# Patient Record
Sex: Female | Born: 1955
Health system: Southern US, Community
[De-identification: ages and names within clinical notes are randomized; demographics above are authoritative.]

## PROBLEM LIST (undated history)

## (undated) DIAGNOSIS — K5792 Diverticulitis of intestine, part unspecified, without perforation or abscess without bleeding: Secondary | ICD-10-CM

## (undated) DIAGNOSIS — Z8619 Personal history of other infectious and parasitic diseases: Secondary | ICD-10-CM

## (undated) DIAGNOSIS — E039 Hypothyroidism, unspecified: Secondary | ICD-10-CM

## (undated) DIAGNOSIS — J45909 Unspecified asthma, uncomplicated: Secondary | ICD-10-CM

## (undated) HISTORY — DX: Personal history of other infectious and parasitic diseases: Z86.19

## (undated) HISTORY — DX: Diverticulitis of intestine, part unspecified, without perforation or abscess without bleeding: K57.92

## (undated) HISTORY — PX: DERMOID CYST  EXCISION: SHX1452

## (undated) HISTORY — DX: Unspecified asthma, uncomplicated: J45.909

## (undated) HISTORY — DX: Hypothyroidism, unspecified: E03.9

---

## 1998-04-24 ENCOUNTER — Other Ambulatory Visit: Admission: RE | Admit: 1998-04-24 | Discharge: 1998-04-24 | Payer: Self-pay | Admitting: Obstetrics and Gynecology

## 1999-10-20 ENCOUNTER — Other Ambulatory Visit: Admission: RE | Admit: 1999-10-20 | Discharge: 1999-10-20 | Payer: Self-pay | Admitting: Obstetrics and Gynecology

## 2001-05-03 ENCOUNTER — Other Ambulatory Visit: Admission: RE | Admit: 2001-05-03 | Discharge: 2001-05-03 | Payer: Self-pay | Admitting: Obstetrics and Gynecology

## 2002-10-20 ENCOUNTER — Other Ambulatory Visit: Admission: RE | Admit: 2002-10-20 | Discharge: 2002-10-20 | Payer: Self-pay | Admitting: Obstetrics and Gynecology

## 2003-04-02 ENCOUNTER — Encounter: Payer: Self-pay | Admitting: Internal Medicine

## 2003-04-02 ENCOUNTER — Ambulatory Visit (HOSPITAL_COMMUNITY): Admission: RE | Admit: 2003-04-02 | Discharge: 2003-04-02 | Payer: Self-pay | Admitting: Internal Medicine

## 2003-11-09 ENCOUNTER — Other Ambulatory Visit: Admission: RE | Admit: 2003-11-09 | Discharge: 2003-11-09 | Payer: Self-pay | Admitting: Obstetrics and Gynecology

## 2005-01-16 ENCOUNTER — Ambulatory Visit: Payer: Self-pay | Admitting: Internal Medicine

## 2005-04-06 ENCOUNTER — Ambulatory Visit: Payer: Self-pay | Admitting: Internal Medicine

## 2005-05-04 ENCOUNTER — Other Ambulatory Visit: Admission: RE | Admit: 2005-05-04 | Discharge: 2005-05-04 | Payer: Self-pay | Admitting: Obstetrics and Gynecology

## 2005-05-29 ENCOUNTER — Encounter: Admission: RE | Admit: 2005-05-29 | Discharge: 2005-05-29 | Payer: Self-pay | Admitting: General Surgery

## 2005-06-15 ENCOUNTER — Encounter (INDEPENDENT_AMBULATORY_CARE_PROVIDER_SITE_OTHER): Payer: Self-pay | Admitting: *Deleted

## 2005-06-15 ENCOUNTER — Inpatient Hospital Stay (HOSPITAL_COMMUNITY): Admission: RE | Admit: 2005-06-15 | Discharge: 2005-06-18 | Payer: Self-pay | Admitting: General Surgery

## 2005-06-15 HISTORY — PX: COLON SURGERY: SHX602

## 2010-10-15 ENCOUNTER — Encounter (INDEPENDENT_AMBULATORY_CARE_PROVIDER_SITE_OTHER): Payer: Self-pay | Admitting: *Deleted

## 2010-10-31 ENCOUNTER — Encounter: Admission: RE | Admit: 2010-10-31 | Discharge: 2010-10-31 | Payer: Self-pay | Admitting: Obstetrics and Gynecology

## 2010-12-03 ENCOUNTER — Encounter (INDEPENDENT_AMBULATORY_CARE_PROVIDER_SITE_OTHER): Payer: Self-pay | Admitting: *Deleted

## 2010-12-05 ENCOUNTER — Ambulatory Visit
Admission: RE | Admit: 2010-12-05 | Discharge: 2010-12-05 | Payer: Self-pay | Source: Home / Self Care | Attending: Internal Medicine | Admitting: Internal Medicine

## 2010-12-05 ENCOUNTER — Telehealth (INDEPENDENT_AMBULATORY_CARE_PROVIDER_SITE_OTHER): Payer: Self-pay | Admitting: *Deleted

## 2010-12-19 ENCOUNTER — Other Ambulatory Visit: Payer: Self-pay | Admitting: Internal Medicine

## 2010-12-19 ENCOUNTER — Ambulatory Visit
Admission: RE | Admit: 2010-12-19 | Discharge: 2010-12-19 | Payer: Self-pay | Source: Home / Self Care | Attending: Internal Medicine | Admitting: Internal Medicine

## 2010-12-24 IMAGING — US US OUTSIDE FILMS BREAST
1 series · 5 of 5 positions shown · non-contrast
Comparison: none

[Series 1: us outside films breast · 5 of 5 slices shown]
[im 1/5]
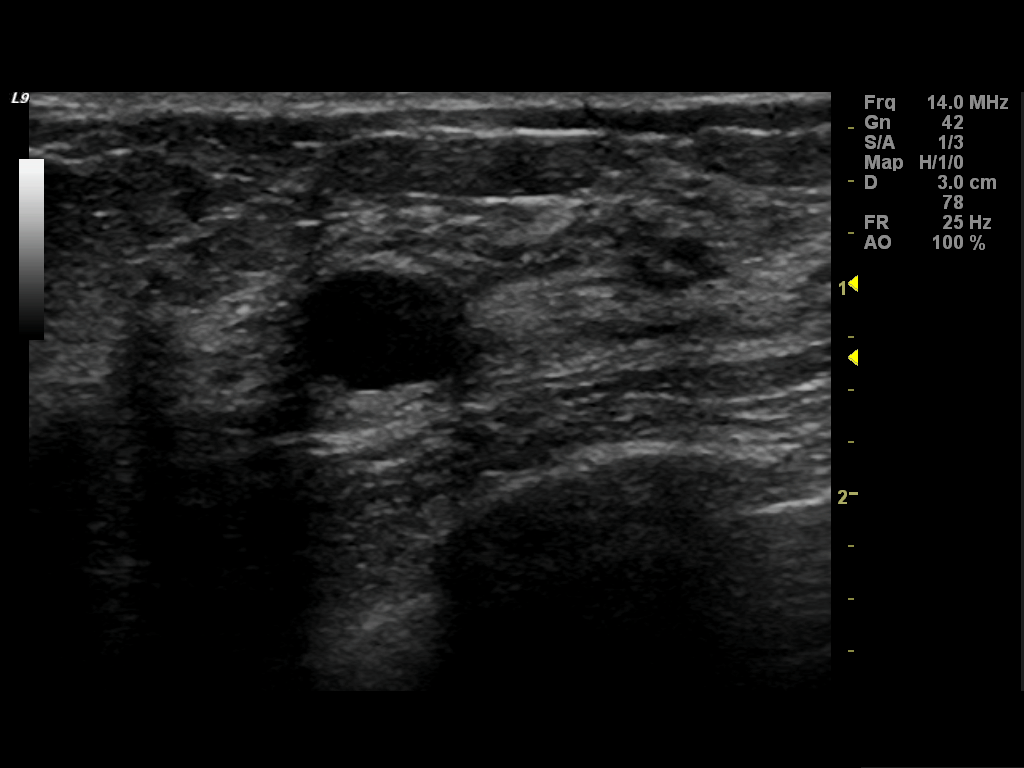
[im 2/5]
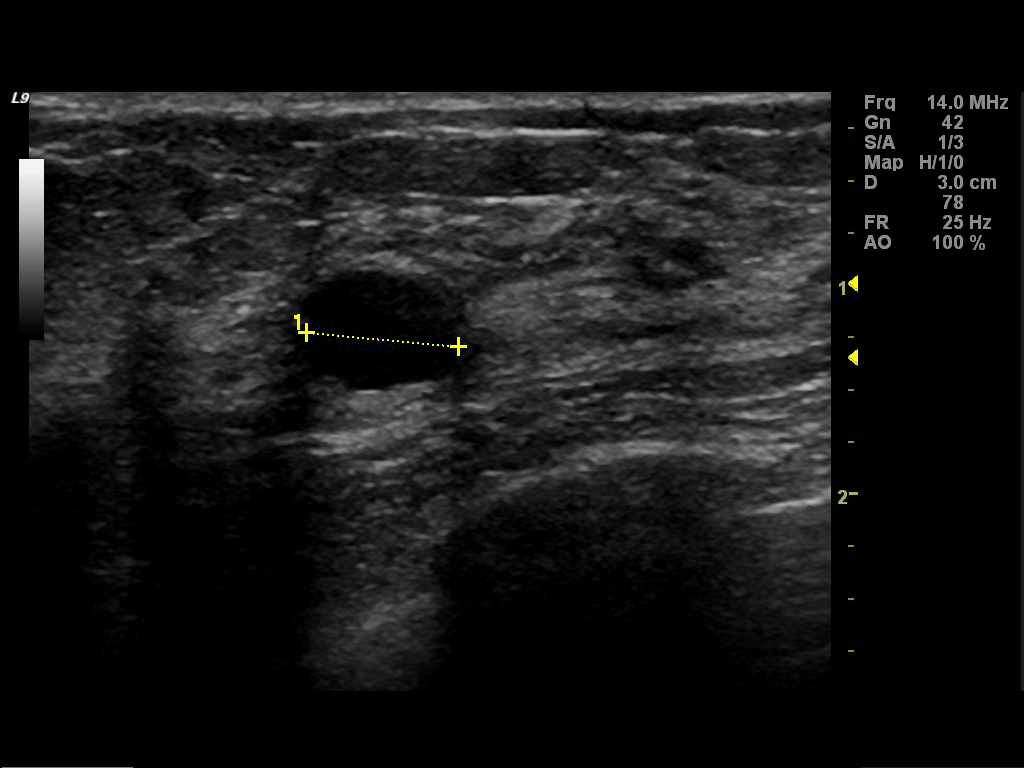
[im 3/5]
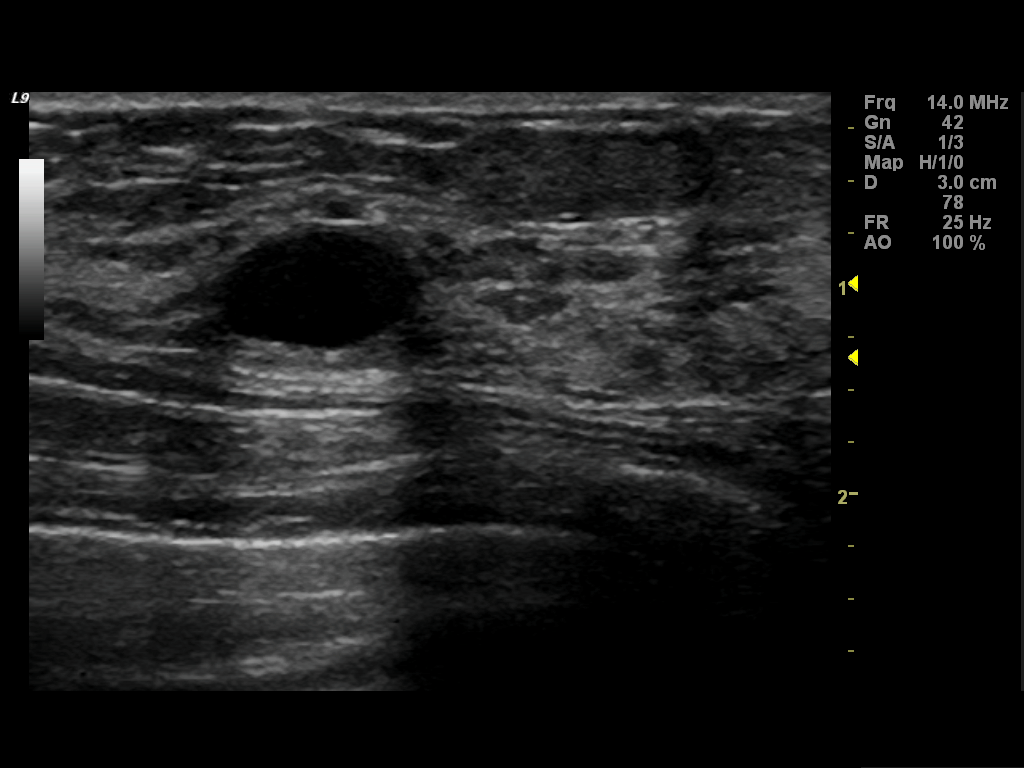
[im 4/5]
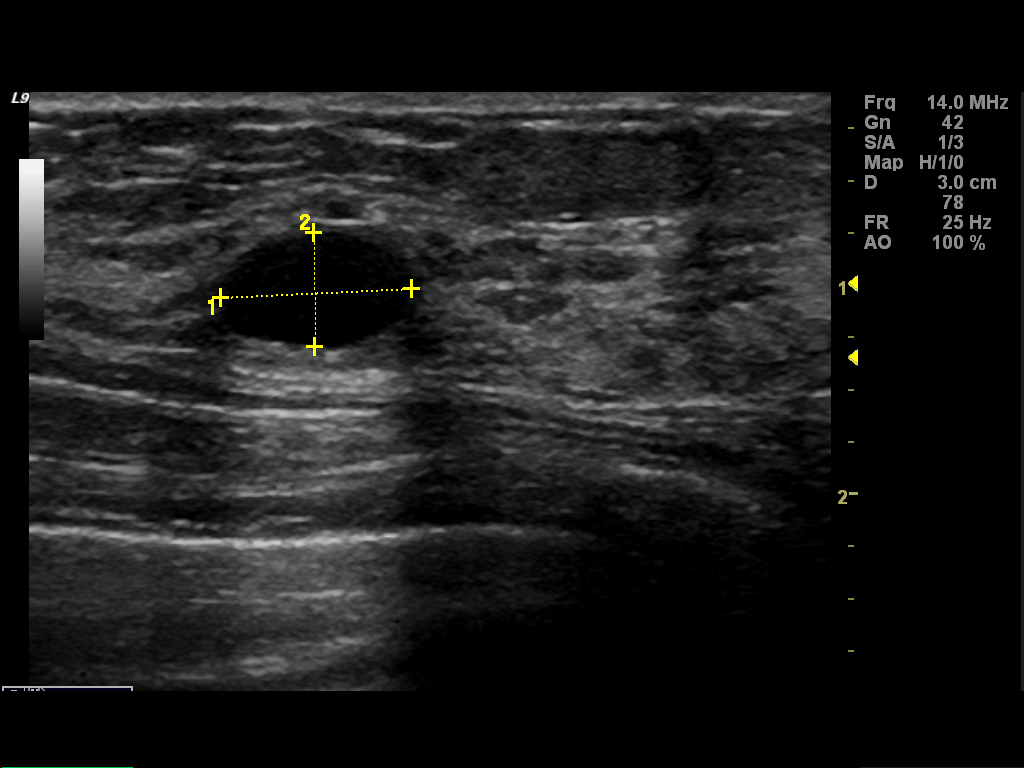
[im 5/5]
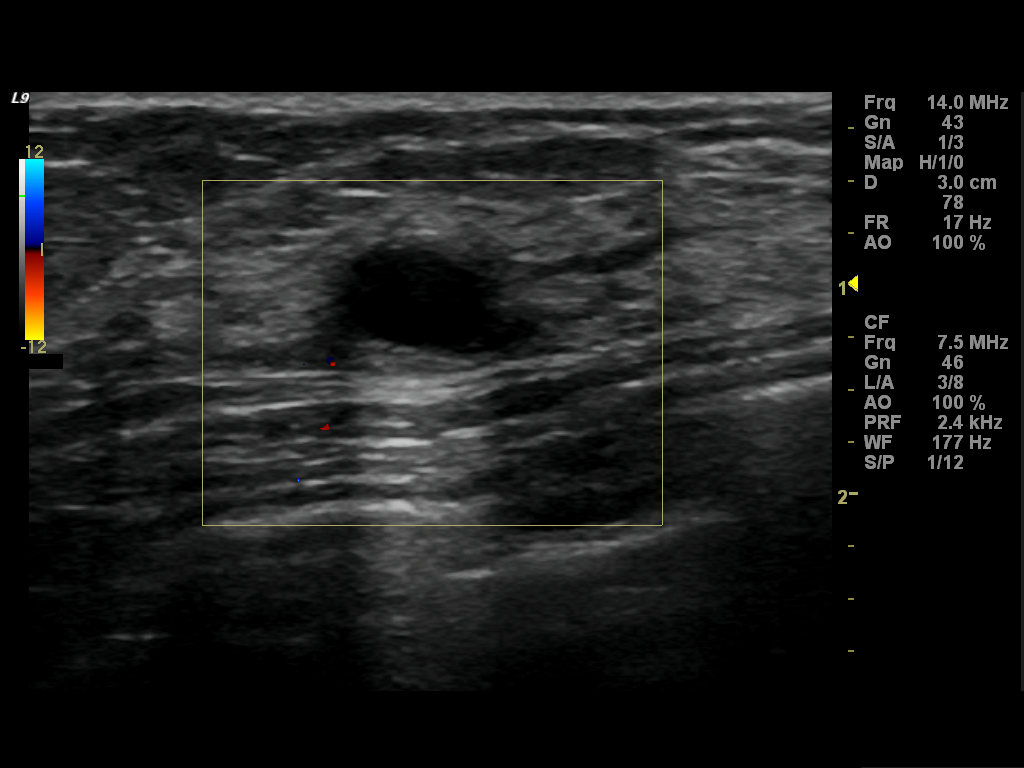

[5 of 5 positions shown; findings below may reference images not displayed]

IMAGES IMPORTED FROM THE SYNGO WORKFLOW SYSTEM
NO DICTATION FOR STUDY

## 2010-12-26 ENCOUNTER — Encounter: Payer: Self-pay | Admitting: Internal Medicine

## 2010-12-30 NOTE — Letter (Signed)
Summary: Pre Visit Letter Revised  [facility]  [address] [place], [place] [address]   Phone: [phone]  Fax: [phone]        10/15/2010 MRN: [id number]   [facility] [address] [place], [place]  [address]             Procedure Date:  11/13/10   Welcome to the Gastroenterology Division at [facility].    You are scheduled to see a nurse for your pre-procedure visit on 10/30/10 at 11:00 a.m. on the 3rd floor at [facility], [address]  We ask that you try to arrive at our office 15 minutes prior to your appointment time to allow for check-in.  Please take a minute to review the attached form.  If you answer "Yes" to one or more of the questions on the first page, we ask that you call the person listed at your earliest opportunity.  If you answer "No" to all of the questions, please complete the rest of the form and bring it to your appointment.    Your nurse visit will consist of discussing your medical and surgical history, your immediate family medical history, and your medications.   If you are unable to list all of your medications on the form, please bring the medication bottles to your appointment and we will list them.  We will need to be aware of both prescribed and over the counter drugs.  We will need to know exact dosage information as well.    Please be prepared to read and sign documents such as consent forms, a financial agreement, and acknowledgement forms.  If necessary, and with your consent, a friend or relative is welcome to sit-in on the nurse visit with you.  Please bring your insurance card so that we may make a copy of it.  If your insurance requires a referral to see a specialist, please bring your referral form from your primary care physician.  No co-pay is required for this nurse visit.     If you cannot keep your appointment, please call [phone] to cancel or reschedule prior to your appointment date.  This allows  [place] the opportunity to schedule an appointment for another patient in need of care.    Thank you for choosing [facility] for your medical needs.  We appreciate the opportunity to care for you.  Please visit [place] at our website  to learn more about our practice.  Sincerely, The Gastroenterology Division

## 2011-01-01 NOTE — Letter (Signed)
Summary: Moviprep Instructions  [facility]  [address]   [place], [place] [address]   Phone: [phone]  Fax: [phone]       Amanda [name]    01-Jun-1956    MRN: [id number]        Procedure Day /Date: Friday, 12-19-10     Arrival Time: 8:30 a.m.      Procedure Time: 9:30 a.m.     Location of Procedure:                    x   [facility] (4th Floor)                        PREPARATION FOR COLONOSCOPY WITH MOVIPREP   Starting 5 days prior to your procedure 12-14-10 do not eat nuts, seeds, popcorn, corn, beans, peas,  salads, or any raw vegetables.  Do not take any fiber supplements (e.g. Metamucil, Citrucel, and Benefiber).  THE DAY BEFORE YOUR PROCEDURE         DATE: 12-18-10  DAY: Thursday 1.  Drink clear liquids the entire day-NO SOLID FOOD  2.  Do not drink anything colored red or purple.  Avoid juices with pulp.  No orange juice.  3.  Drink at least 64 oz. (8 glasses) of fluid/clear liquids during the day to prevent dehydration and help the prep work efficiently.  CLEAR LIQUIDS INCLUDE: Water Jello Ice Popsicles Tea (sugar ok, no milk/cream) Powdered fruit flavored drinks Coffee (sugar ok, no milk/cream) Gatorade Juice: apple, white grape, white cranberry  Lemonade Clear bullion, consomm, broth Carbonated beverages (any kind) Strained chicken noodle soup Hard Candy                             4.  In the morning, mix first dose of MoviPrep solution:    Empty 1 Pouch A and 1 Pouch B into the disposable container    Add lukewarm drinking water to the top line of the container. Mix to dissolve    Refrigerate (mixed solution should be used within 24 hrs)  5.  Begin drinking the prep at 5:00 p.m. The MoviPrep container is divided by 4 marks.   Every 15 minutes drink the solution down to the next mark (approximately 8 oz) until the full liter is complete.   6.  Follow completed prep with 16 oz of clear liquid of your choice  (Nothing red or purple).  Continue to drink clear liquids until bedtime.  7.  Before going to bed, mix second dose of MoviPrep solution:    Empty 1 Pouch A and 1 Pouch B into the disposable container    Add lukewarm drinking water to the top line of the container. Mix to dissolve    Refrigerate  THE DAY OF YOUR PROCEDURE      DATE: 12-19-10  DAY: Friday  Beginning at 4:30 a.m. (5 hours before procedure):         1. Every 15 minutes, drink the solution down to the next mark (approx 8 oz) until the full liter is complete.  2. Follow completed prep with 16 oz. of clear liquid of your choice.    3. You may drink clear liquids until 7:30 a.m.  (2 HOURS BEFORE PROCEDURE).   MEDICATION INSTRUCTIONS  Unless otherwise instructed, you should take regular prescription medications with a small sip of water   as early as possible the  morning of your procedure.         OTHER INSTRUCTIONS  You will need a responsible adult at least 55 years of age to accompany you and drive you home.   This person must remain in the waiting room during your procedure.  Wear loose fitting clothing that is easily removed.  Leave jewelry and other valuables at home.  However, you may wish to bring a book to read or  an iPod/MP3 player to listen to music as you wait for your procedure to start.  Remove all body piercing jewelry and leave at home.  Total time from sign-in until discharge is approximately 2-3 hours.  You should go home directly after your procedure and rest.  You can resume normal activities the  day after your procedure.  The day of your procedure you should not:   Drive   Make legal decisions   Operate machinery   Drink alcohol   Return to work  You will receive specific instructions about eating, activities and medications before you leave.    The above instructions have been reviewed and explained to me by   Ezra Sites RN  December 05, 2010 1:53 PM     I fully  understand and can verbalize these instructions _____________________________ Date _________

## 2011-01-01 NOTE — Letter (Addendum)
Summary: Patient Notice-Hyperplastic Polyps   Gastroenterology  [address] [place], [place] [address]   Phone: [phone]  Fax: [phone]        December 26, 2010 MRN: [id number]    [facility] [address] [place], [place]  [address]    Dear [name],  I am pleased to inform you that the colon polyp removed during your recent colonoscopy was NOT pre-cancerous.  It is therefore my recommendation that you have a repeat colonoscopy examination in 10 years for routine colorectal cancer screening.  Should you develop new or worsening symptoms of abdominal pain, bowel habit changes or bleeding from the rectum or bowels, please schedule an evaluation with either your primary care physician or with me.  Please call us if you are having persistent problems or have questions about your condition that have not been fully answered at this time.  Sincerely,  Iva Boop MD, [facility] This letter has been electronically signed by your physician.  Appended Document: Patient Notice-Hyperplastic Polyps LETTER MAILED

## 2011-01-01 NOTE — Procedures (Addendum)
Summary: Colonoscopy  Patient: Amanda [name] Note: All result statuses are Final unless otherwise noted.  Tests: (1) Colonoscopy (COL)   COL Colonoscopy           DONE (C)     [facility]     [address]     [place], [place]  [address]           COLONOSCOPY PROCEDURE REPORT           PATIENT:  [name], [name]  MR#:  [id number]     BIRTHDATE:  17-Jun-1956, 54 yrs. old  GENDER:  female     ENDOSCOPIST:  Iva Boop, MD, [facility]           PROCEDURE DATE:  12/19/2010     PROCEDURE:  Colonoscopy with snare polypectomy     ASA CLASS:  Class II     INDICATIONS:  Routine Risk Screening     MEDICATIONS:   Fentanyl 100 mcg IV, Versed 12 mg IV           DESCRIPTION OF PROCEDURE:   After the risks benefits and     alternatives of the procedure were thoroughly explained, informed     consent was obtained.  Digital rectal exam was performed and     revealed no abnormalities.   The LB 180AL [id number] endoscope was     introduced through the anus and advanced to the cecum, which was     identified by both the appendix and ileocecal valve, without     limitations.  The quality of the prep was excellent, using     MoviPrep CORRECTION: MIRALAX.  The instrument was then slowly     withdrawn as the colon was fully examined. Insertion: 1:30 minutes     withdrawal; 11:31 minutes     <<PROCEDUREIMAGES>>           FINDINGS:  A sessile polyp was found in the sigmoid colon. It was     6 mm in size. Polyp was snared without cautery. Retrieval was     successful.  There was a surgical anastomosis in the sigmoid colon     from prior segmental resection.  Mild diverticulosis was found in     the left colon.   This was otherwise a normal examination of the     colon.   Retroflexed views in the rectum revealed no     abnormalities.    The scope was then withdrawn from the patient     and the procedure completed.           COMPLICATIONS:  None     ENDOSCOPIC IMPRESSION:     1) 6 mm sessile polyp  in the sigmoid colon - removed     2) Anastomosis in the sigmoid colon - prior segmental resection     for recurrent diverticulitis     3) Mild diverticulosis in the left colon     4) Otherwise normal examination with excellent prep           REPEAT EXAM:  In for Colonoscopy, pending biopsy results.           Iva Boop, MD, [facility]           CC:  Jerl Mina, MD     The Patient           n.     REVISED:  12/19/2010 11:43 AM     eSIGNED:   Iva Boop at  12/19/2010 11:43 AM           Judeth Porch, [id number]  Note: An exclamation mark (!) indicates a result that was not dispersed into the flowsheet. Document Creation Date: 12/19/2010 11:43 AM _______________________________________________________________________  (1) Order result status: Final Collection or observation date-time: 12/19/2010 10:30 Requested date-time:  Receipt date-time:  Reported date-time:  Referring Physician:   Ordering Physician: Stan Head [id number]) Specimen Source:  Source: Launa Grill Order Number: [id number] Lab site:   Appended Document: Colonoscopy     Procedures Next Due Date:    Colonoscopy: 12/2020

## 2011-01-01 NOTE — Progress Notes (Signed)
Summary: Need for colon now?  Phone Note Call from Patient   Summary of Call: Dr. Leone Payor, I have put Amanda [name] chart on your desk for review.  She had colonoscopy in 2004 for follow up of diverticulitis. You recommended recall in 2009 for screening.  She had laparoscopic sigmoid colectomy in 2006. Do you still want her to have colon now (scheduled for 12/19/10)?  If so, is it screening or follow up from colectomy?  Please advise. Initial call taken by: Amanda Sites RN,  December 05, 2010 3:08 PM  Follow-up for Phone Call        she should have the colonoscopy and the diagnosis is screening Follow-up by: Amanda Boop MD, Amanda [name],  December 05, 2010 4:55 PM

## 2011-01-01 NOTE — Miscellaneous (Signed)
Summary: [facility]  Clinical Lists Changes  Medications: Added new medication of MOVIPREP 100 GM  SOLR (PEG-KCL-NACL-NASULF-NA ASC-C) As per prep instructions. - Signed Rx of MOVIPREP 100 GM  SOLR (PEG-KCL-NACL-NASULF-NA ASC-C) As per prep instructions.;  #1 x 0;  Signed;  Entered by: Ezra Sites RN;  Authorized by: Iva Boop MD, [facility];  Method used: Electronically to [facility] [address] [place], [place], [place]  [address], Ph: [phone], Fax: [phone] Observations: Added new observation of NKA: T (12/05/2010 13:30)    Prescriptions: MOVIPREP 100 GM  SOLR (PEG-KCL-NACL-NASULF-NA ASC-C) As per prep instructions.  #1 x 0   Entered by:   Ezra Sites RN   Authorized by:   Iva Boop MD, [facility]   Signed by:   Ezra Sites RN on 12/05/2010   Method used:   Electronically to        [facility]  [address] (retail)       [address], [address]       [place], [place]  [address]       Ph: [phone]       Fax: [phone]   RxID:   [id number]

## 2011-04-17 NOTE — Discharge Summary (Signed)
[name], [name]               ACCOUNT NO.:  [id number]   MEDICAL RECORD NO.:  [id number]          PATIENT TYPE:  INP   LOCATION:  1506                         FACILITY:  [facility]   PHYSICIAN:  Adolph Pollack, M.D.DATE OF BIRTH:  [birth year]   DATE OF ADMISSION:  06/15/2005  DATE OF DISCHARGE:  06/18/2005                                 DISCHARGE SUMMARY   PRINCIPAL DISCHARGE DIAGNOSIS:  Recurrent sigmoid diverticulitis.   REASON FOR ADMISSION:  This 55 year old female has had recurring bouts of  sigmoid diverticulitis and has been treated multiple times, specifically  this year.  She was admitted for elective laparoscopic sigmoid colectomy.   HOSPITAL COURSE:  She underwent a laparoscopic sigmoid colectomy on June 15, 2005 and tolerated it well.  The first postoperative day she started on a  clear liquid diet and ambulated.  She began having some flatus on the second  postoperative day and was advanced to a solid diet and oral pain medicine.  By the third postoperative day she was having some small liquid bowel  movements and tolerating a diet.  The wounds were clean, dry, and intact.  She was afebrile and ready to be discharged.   DISPOSITION:  Discharged to home on June 20, 2005 in satisfactory condition.   DISCHARGE INSTRUCTIONS:  She has been given specific discharge instructions.   DISCHARGE MEDICATIONS:  Tylox for pain.   FOLLOW UP:  She will return to the office in four days for staple removal.       TJR/MEDQ  D:  06/18/2005  T:  06/18/2005  Job:  [id number]   cc:   Iva Boop, M.D. [facility]  [address] [place], [place] [address]

## 2011-04-17 NOTE — Op Note (Signed)
NAMEKYNEDI, PROFITT               ACCOUNT NO.:  [id number]   MEDICAL RECORD NO.:  [id number]          PATIENT TYPE:  INP   LOCATION:  1506                         FACILITY:  [facility]   PHYSICIAN:  Adolph Pollack, M.D.DATE OF BIRTH:  Dec 09, 1955   DATE OF PROCEDURE:  06/15/2005  DATE OF DISCHARGE:                                 OPERATIVE REPORT   PREOPERATIVE DIAGNOSIS:  Recurrent sigmoid diverticulitis.   POSTOPERATIVE DIAGNOSIS:  Recurrent sigmoid diverticulitis.   PROCEDURE:  Laparoscopic-assisted sigmoid colectomy.   SURGEON:  Adolph Pollack, M.D.   ASSISTANT:  Anselm Pancoast. Zachery Dakins, M.D.   ANESTHESIA:  General.   INDICATIONS:  Ms. Fullington is a 55 year old female who a couple years ago had  a documented bout of sigmoid diverticulitis.  She has had sigmoid  diverticulitis.  She had a couple mild since January of this year. She now  presents for elective laparoscopic assisted sigmoid colectomy.  We discussed  the procedure and the risks.  A barium enema does show a fairly focal  disease in the mid to proximal sigmoid colon.   DESCRIPTION OF PROCEDURE:  She was seen in the holding area and brought to  the operating room, placed supine on the operating table, and a general  anesthetic was administered.  She was placed in the lithotomy position.  A  Foley catheter was inserted.  The abdominal wall and perineal area was  sterilely prepped and draped.  A small supraumbilical incision was made  through the skin and subcutaneous tissue and fascial layers, and the  peritoneal cavity was entered. The pursestring suture of 0 Vicryl placed  around the fascial edges.  A Hassan trocar was introduced into the  peritoneal cavity. Pneumoperitoneum was created by insufflation of CO2 gas.   Following this, a laparoscope was introduced.  A 5 mm trocar was placed in  the lower midline in the suprapubic area.  She was placed in Trendelenburg  position with the right side tilted down.  A 10  mm trocar was placed in the  right lower quadrant region.  I began by noticing the an adherent area and  inflammatory area in the mid to proximal sigmoid colon to the lateral  sidewall.  I divided these adhesions and began mobilizing the segment.  I  then divided the white line of Toldt up to the mid to proximal descending  colon mobilizing the descending colon.  I identified the ureter and traced  it inferiorly.  I subsequently divided the lateral peritoneal attachments  all the way down to the rectosigmoid junction.  This provided plenty of  mobilization both proximal and distal to the diseased segment.   Following this, I removed the 5 mm trocar and made a lower transverse  incision through skin and subcutaneous tissue and mobilized some of the  subcutaneous tissue.  I then made a midline incision in the lower abdominal  wall fascia noted and entered the peritoneal cavity.  I grasped the diseased  segment. Mobilization was adequate.  I then divided the colon at the  descending colon and sigmoid junction and just above the  sigmoid rectal  junction distally.  The mesentery and the vessels were then divided between  clamps and ligated.  The specimen was handed off the field.   I then approximated the proximal and distal ends of the colon, and they were  under no tension.  A single layer hand sewn anastomosis was done in an  inverted fashion using interrupted 3-0 silk. The anastomosis was patent,  viable and under no tension. Tisseel was then placed around the anastomosis.   The abdominal cavity was irrigated and fluid was evacuated.  Hemostasis was  adequate.  I then placed sigmoid colon up back in the pelvic area and  flattened the patient out.  Omentum was placed over the intestines.  Needle,  sponge and instrument counts were reported to be correct.  The fascia was  then closed a single layer with a running #1 PDS suture.  Subcutaneous  tissue was irrigated. The abdomen was  reinsufflated, and the laparoscope was  introduced.  The fascial closure was solid.  No bleeding or significant  irrigation fluid was noted.  Subsequently, all trocars were removed and  pneumoperitoneum was released.  The supraumbilical fascia defect was closed  by tightening up and tying down the pursestring suture.  All incisions were  then closed with staples followed by sterile dressings.   She tolerated the procedure without apparent complications and was taken to  recovery in satisfactory condition.       TJR/MEDQ  D:  06/15/2005  T:  06/15/2005  Job:  [id number]   cc:   Iva Boop, M.D. [facility]  [address] [place], [place] [address]

## 2013-09-04 ENCOUNTER — Ambulatory Visit (INDEPENDENT_AMBULATORY_CARE_PROVIDER_SITE_OTHER)
Admission: RE | Admit: 2013-09-04 | Discharge: 2013-09-04 | Disposition: A | Discharge status: Home / Self Care | Payer: BC Managed Care – PPO | Source: Ambulatory Visit | Attending: Internal Medicine | Admitting: Internal Medicine

## 2013-09-04 ENCOUNTER — Ambulatory Visit (INDEPENDENT_AMBULATORY_CARE_PROVIDER_SITE_OTHER): Payer: BC Managed Care – PPO | Admitting: Internal Medicine

## 2013-09-04 ENCOUNTER — Other Ambulatory Visit: Payer: Self-pay | Admitting: Internal Medicine

## 2013-09-04 ENCOUNTER — Encounter: Payer: Self-pay | Admitting: Internal Medicine

## 2013-09-04 VITALS — BP 110/80 | HR 109 | Temp 98.1°F | Ht 65.0 in | Wt 140.0 lb

## 2013-09-04 DIAGNOSIS — E039 Hypothyroidism, unspecified: Secondary | ICD-10-CM

## 2013-09-04 DIAGNOSIS — Z13220 Encounter for screening for lipoid disorders: Secondary | ICD-10-CM

## 2013-09-04 DIAGNOSIS — R05 Cough: Secondary | ICD-10-CM

## 2013-09-04 DIAGNOSIS — R059 Cough, unspecified: Secondary | ICD-10-CM

## 2013-09-04 DIAGNOSIS — N951 Menopausal and female climacteric states: Secondary | ICD-10-CM

## 2013-09-04 DIAGNOSIS — J45909 Unspecified asthma, uncomplicated: Secondary | ICD-10-CM

## 2013-09-04 DIAGNOSIS — Z780 Asymptomatic menopausal state: Secondary | ICD-10-CM

## 2013-09-04 DIAGNOSIS — R053 Chronic cough: Secondary | ICD-10-CM

## 2013-09-04 DIAGNOSIS — Z23 Encounter for immunization: Secondary | ICD-10-CM

## 2013-09-04 DIAGNOSIS — D72819 Decreased white blood cell count, unspecified: Secondary | ICD-10-CM

## 2013-09-04 LAB — CBC WITH DIFFERENTIAL/PLATELET
Basophils Relative: 0.5 % (ref 0.0–3.0)
Eosinophils Relative: 0.4 % (ref 0.0–5.0)
Hemoglobin: 14.6 g/dL (ref 12.0–15.0)
Lymphocytes Relative: 35.6 % (ref 12.0–46.0)
Monocytes Relative: 7.7 % (ref 3.0–12.0)
Neutro Abs: 2 10*3/uL (ref 1.4–7.7)
RBC: 4.8 Mil/uL (ref 3.87–5.11)

## 2013-09-04 LAB — COMPREHENSIVE METABOLIC PANEL
BUN: 13 mg/dL (ref 6–23)
CO2: 30 mEq/L (ref 19–32)
Calcium: 9.3 mg/dL (ref 8.4–10.5)
Chloride: 103 mEq/L (ref 96–112)
Creatinine, Ser: 0.9 mg/dL (ref 0.4–1.2)
GFR: 70.29 mL/min (ref >60.00)

## 2013-09-04 LAB — LIPID PANEL
Cholesterol: 201 mg/dL — ABNORMAL HIGH (ref 0–200)
Triglycerides: 75 mg/dL (ref 0.0–149.0)

## 2013-09-04 LAB — LDL CHOLESTEROL, DIRECT: Direct LDL: 109.2 mg/dL

## 2013-09-04 NOTE — Progress Notes (Signed)
Order placed for f/u labs.  

## 2013-09-06 ENCOUNTER — Encounter: Payer: Self-pay | Admitting: Internal Medicine

## 2013-09-06 DIAGNOSIS — R05 Cough: Secondary | ICD-10-CM | POA: Insufficient documentation

## 2013-09-06 DIAGNOSIS — J45909 Unspecified asthma, uncomplicated: Secondary | ICD-10-CM | POA: Insufficient documentation

## 2013-09-06 DIAGNOSIS — E039 Hypothyroidism, unspecified: Secondary | ICD-10-CM | POA: Insufficient documentation

## 2013-09-06 DIAGNOSIS — Z780 Asymptomatic menopausal state: Secondary | ICD-10-CM | POA: Insufficient documentation

## 2013-09-06 DIAGNOSIS — R053 Chronic cough: Secondary | ICD-10-CM | POA: Insufficient documentation

## 2013-09-06 NOTE — Assessment & Plan Note (Signed)
On thyroid replacement.  Follow tsh.  

## 2013-09-06 NOTE — Assessment & Plan Note (Signed)
Has had extensive w/up.  Has been treated for allergies, GERD and asthma - with incomplete resolution.  Told was related to asthma.  She overall feels is stable.  Check cxr.

## 2013-09-06 NOTE — Assessment & Plan Note (Signed)
No period for two years.  Doing well.  Follow.    

## 2013-09-06 NOTE — Progress Notes (Signed)
  Subjective:    Patient ID: Amanda [name], female    DOB: [birth year], 57 y.o.   MRN: [id number]  HPI 57 year old female with past history of hypothyroidism and a chronic cough (felt to be related to asthma).  She comes in today to follow up on these issues as well as to establish care.  Has been followed at [facility] in [place].  Has also seen Dr  Burnett Sheng previously.  States she is due a physical.  Has not had a period for two years.  Some occasional hot flashes.  Not severe.  States she had one abnormal pap smear when pregnant.  All subsequent pap smears have been normal.  She states that she does have a chronic cough.  Worse in am.  Some increased drainage and some increased mucus.  No cough at night.  States it has been persistent for 20 years.  Has had extensive w/up.  Was told it was related to asthma.  States previous PFTs revealed "mild asthma".  Overall she feels she is doing well.  Tries to stay active.     Past Medical History  Diagnosis Date  . Asthma   . History of chicken pox   . Diverticulitis     H/O  . Hypothyroidism     Outpatient Encounter Prescriptions as of 09/04/2013  Medication Sig Dispense Refill  . levothyroxine (SYNTHROID, LEVOTHROID) 88 MCG tablet Take 88 mcg by mouth daily before breakfast.       No facility-administered encounter medications on file as of 09/04/2013.    Review of Systems Patient denies any headache, lightheadedness or dizziness.  Some increased mucus.  Question of increased drainage.  No chest pain, tightness or palpitations.  No increased shortness of breath.  Does report the cough as outlined.   No nausea or vomiting.  No acid reflux.  No abdominal pain or cramping.  No bowel change, such as diarrhea, constipation, BRBPR or melana.  No urine change.   Tries to stay active.  Overall she feels she is doing relatively well.      Objective:   Physical Exam Filed Vitals:   09/04/13 1331  BP: 110/80  Pulse: 109  Temp: 98.1 F (36.7 C)   pulse recheck:  69  57 year old female in no acute distress.   HEENT:  Nares- clear.  Oropharynx - without lesions. NECK:  Supple.  Nontender.  No audible bruit.  HEART:  Appears to be regular. LUNGS:  No crackles or wheezing audible.  Respirations even and unlabored.  RADIAL PULSE:  Equal bilaterally.   ABDOMEN:  Soft, nontender.  Bowel sounds present and normal.  No audible abdominal bruit.   EXTREMITIES:  No increased edema present.  DP pulses palpable and equal bilaterally.          Assessment & Plan:  HEALTH MAINTENANCE.  Schedule her for a physical.  Last mammogram 06/09/12 - ok (per her report).  Obtain records.  Last colonoscopy 11/2010.  Recommended f/u 10 years.    I spent 45 minutes with the patient and more than 50% of the time was spent in consultation regarding the above.

## 2013-09-06 NOTE — Assessment & Plan Note (Signed)
No sob.  No symptoms with exercise.  Follow.

## 2013-09-22 ENCOUNTER — Other Ambulatory Visit (INDEPENDENT_AMBULATORY_CARE_PROVIDER_SITE_OTHER): Payer: BC Managed Care – PPO

## 2013-09-22 DIAGNOSIS — D72819 Decreased white blood cell count, unspecified: Secondary | ICD-10-CM

## 2013-09-22 LAB — CBC WITH DIFFERENTIAL/PLATELET
Basophils Absolute: 0 10*3/uL (ref 0.0–0.1)
Basophils Relative: 0.8 % (ref 0.0–3.0)
HCT: 41.4 % (ref 36.0–46.0)
Hemoglobin: 14.1 g/dL (ref 12.0–15.0)
Lymphs Abs: 1.5 10*3/uL (ref 0.7–4.0)
MCHC: 34.1 g/dL (ref 30.0–36.0)
Monocytes Relative: 8.3 % (ref 3.0–12.0)
Neutro Abs: 2.4 10*3/uL (ref 1.4–7.7)
RBC: 4.61 Mil/uL (ref 3.87–5.11)
RDW: 13.2 % (ref 11.5–14.6)

## 2013-09-23 ENCOUNTER — Encounter: Payer: Self-pay | Admitting: Internal Medicine

## 2013-09-25 NOTE — Telephone Encounter (Signed)
Mailed unread message to pt  

## 2013-10-28 IMAGING — CR DG CHEST 2V
2 series · 2 of 2 positions shown · non-contrast
Comparison: 06/10/2005

CLINICAL DATA: Persistent cough.  Asthma.

EXAM:
CHEST  2 VIEW

[view not recorded (1 of 2)]
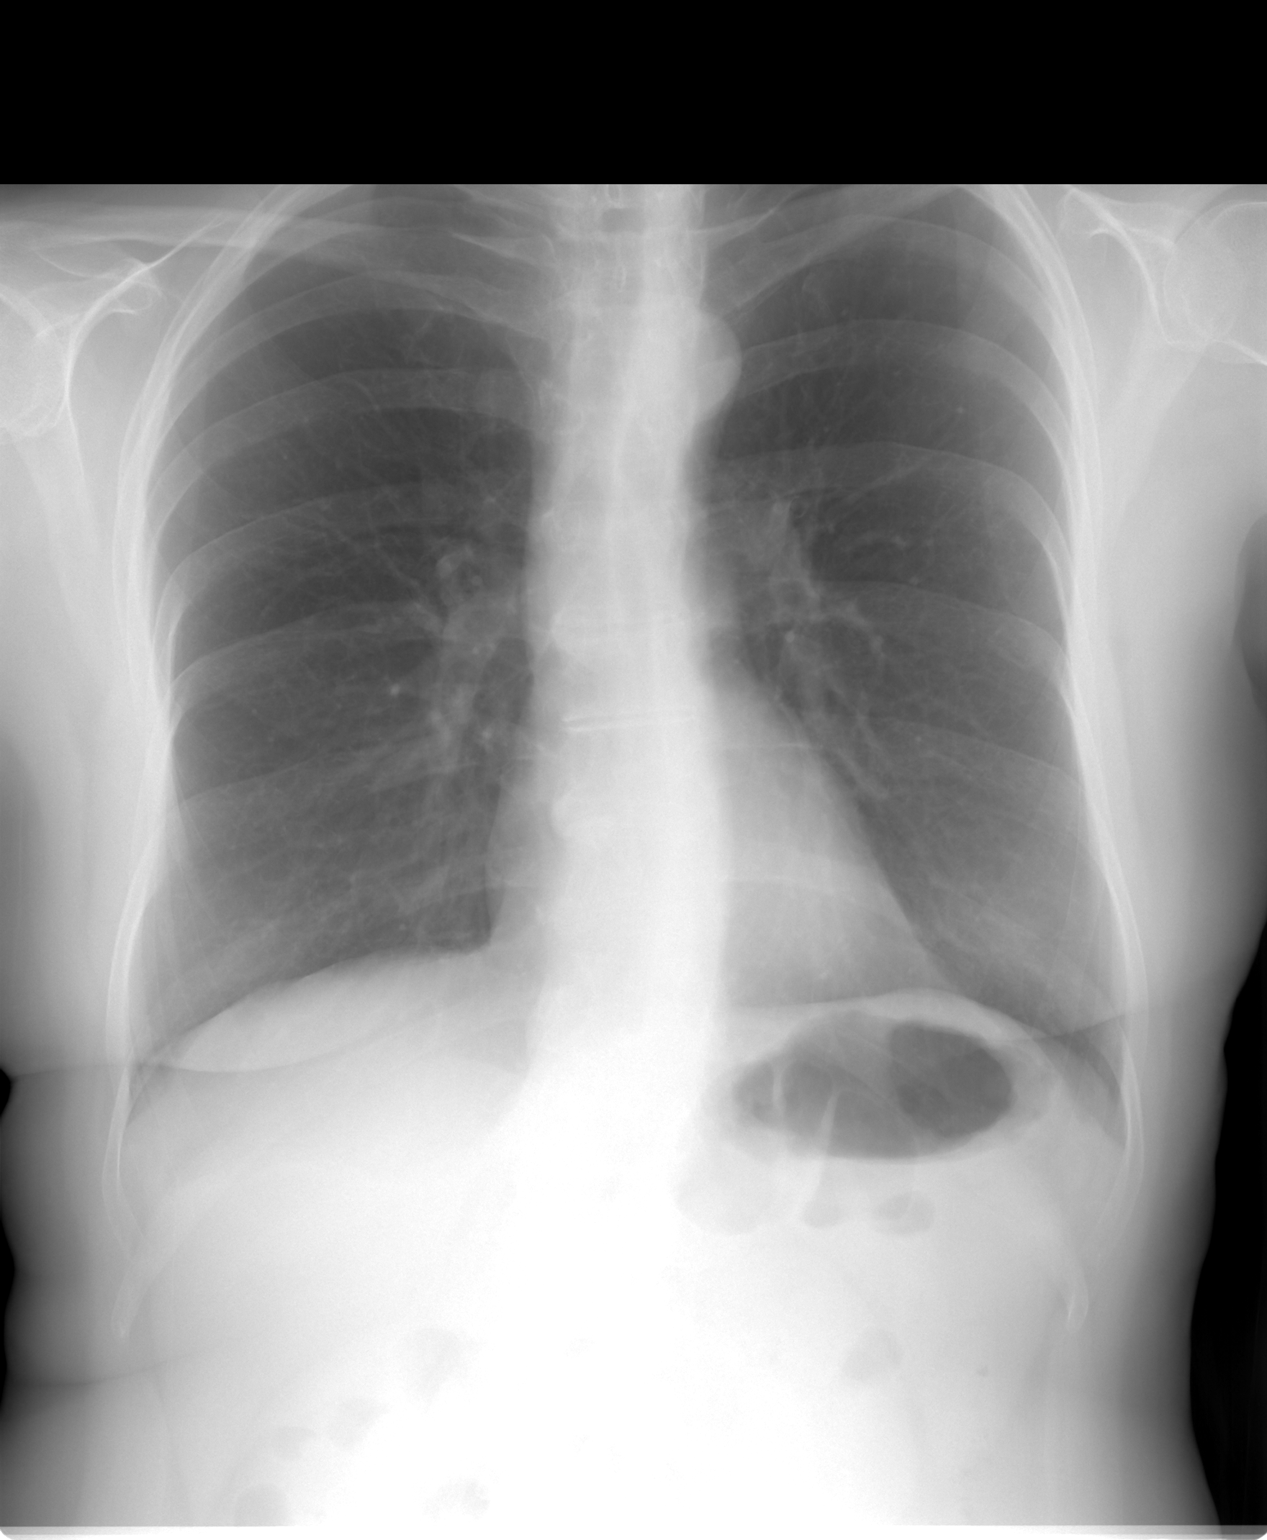

[view not recorded (2 of 2)]
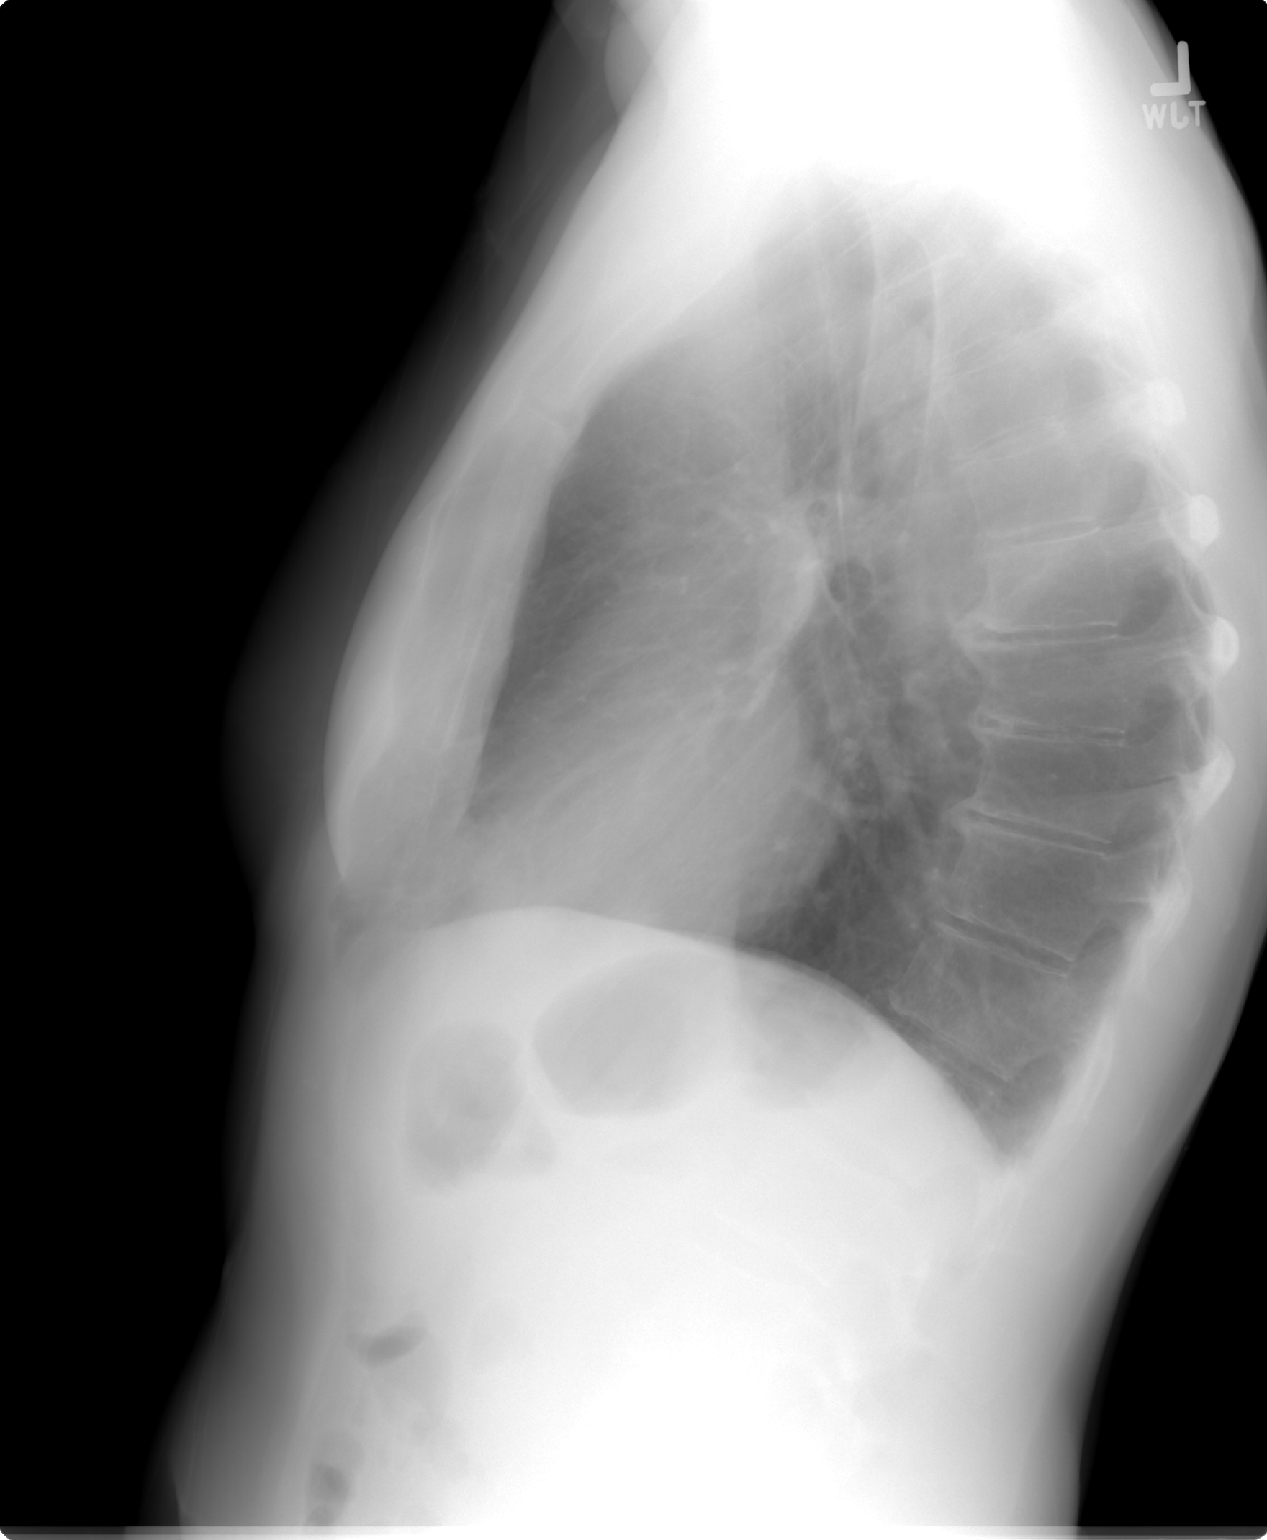

[2 of 2 positions shown; findings below may reference images not displayed]

FINDINGS: The heart size and mediastinal contours are within normal limits.
Both lungs are clear. Mild pulmonary hyperinflation is unchanged in
consistent with chronic asthma or COPD.
IMPRESSION: Stable exam. Probable COPD. No active cardiopulmonary disease.

## 2013-11-10 ENCOUNTER — Telehealth: Payer: Self-pay | Admitting: Internal Medicine

## 2013-11-10 ENCOUNTER — Encounter: Payer: BC Managed Care – PPO | Admitting: Internal Medicine

## 2014-01-01 ENCOUNTER — Ambulatory Visit (INDEPENDENT_AMBULATORY_CARE_PROVIDER_SITE_OTHER): Payer: Federal, State, Local not specified - PPO | Admitting: Internal Medicine

## 2014-01-01 ENCOUNTER — Encounter: Payer: Self-pay | Admitting: Internal Medicine

## 2014-01-01 VITALS — BP 130/90 | HR 110 | Temp 98.0°F | Ht 64.5 in | Wt 143.8 lb

## 2014-01-01 DIAGNOSIS — E039 Hypothyroidism, unspecified: Secondary | ICD-10-CM

## 2014-01-01 DIAGNOSIS — R059 Cough, unspecified: Secondary | ICD-10-CM

## 2014-01-01 DIAGNOSIS — Z780 Asymptomatic menopausal state: Secondary | ICD-10-CM

## 2014-01-01 DIAGNOSIS — N841 Polyp of cervix uteri: Secondary | ICD-10-CM

## 2014-01-01 DIAGNOSIS — R05 Cough: Secondary | ICD-10-CM

## 2014-01-01 DIAGNOSIS — J45909 Unspecified asthma, uncomplicated: Secondary | ICD-10-CM

## 2014-01-01 DIAGNOSIS — N951 Menopausal and female climacteric states: Secondary | ICD-10-CM

## 2014-01-01 DIAGNOSIS — R053 Chronic cough: Secondary | ICD-10-CM

## 2014-01-01 MED ORDER — LEVOTHYROXINE SODIUM 88 MCG PO TABS: 88.0000 ug | ORAL_TABLET | Freq: Every day | ORAL | Status: DC

## 2014-01-01 MED ORDER — FLUTICASONE PROPIONATE 50 MCG/ACT NA SUSP: 2.0000 | Freq: Every day | NASAL | Status: DC

## 2014-01-01 NOTE — Assessment & Plan Note (Addendum)
Has had extensive w/up.  Has been treated for allergies, GERD and asthma - with incomplete resolution.  Told was related to asthma.  She overall feels is stable.  CXR checked after last visit - ok.  With the increased burping - will start zantac.  Also start flonase to help with the nasal dripping, etc.  See if cough improves with these measures.  Discussed using an inhaler before exercise.

## 2014-01-01 NOTE — Assessment & Plan Note (Addendum)
No period for two years.  Doing well.  Follow.

## 2014-01-01 NOTE — Progress Notes (Signed)
Subjective:    Patient ID: Amanda [name], female    DOB: 09/12/1956, 58 y.o.   MRN: [id number]  HPI 58 year old female with past history of hypothyroidism and a chronic cough (felt to be related to asthma).  She comes in today to follow up on these issues as well as for a complete physical exam.  Has been followed at [facility] in [place].  Has not had a period for over two years.  Some occasional hot flashes.  Not severe.  States she had one abnormal pap smear when pregnant.  All subsequent pap smears have been normal.  She states that she does have a chronic cough.  Worse in am.  Some increased drainage and some increased mucus.  No cough at night.  States it has been persistent for 20 years.  Has had extensive w/up.  Was told it was related to asthma.  States previous PFTs revealed "mild asthma".  Notices more when cold or with exercise.  Overall she feels she is doing well.  Tries to stay active.     Past Medical History  Diagnosis Date  . Asthma   . History of chicken pox   . Diverticulitis     H/O  . Hypothyroidism     Outpatient Encounter Prescriptions as of 01/01/2014  Medication Sig  . levothyroxine (SYNTHROID, LEVOTHROID) 88 MCG tablet Take 1 tablet (88 mcg total) by mouth daily before breakfast.  . [DISCONTINUED] levothyroxine (SYNTHROID, LEVOTHROID) 88 MCG tablet Take 88 mcg by mouth daily before breakfast.    Review of Systems Patient denies any headache, lightheadedness or dizziness.  Some increased mucus - dripping nose.  No chest pain, tightness or palpitations.  No increased shortness of breath.  Does report the cough as outlined.   Cough worse with exercise and cold weather.  No nausea or vomiting.  No acid reflux.  Some increased burping.  No abdominal pain or cramping.  No bowel change, such as diarrhea, constipation, BRBPR or melana.  No urine change.   Tries to stay active.  Overall she feels she is doing relatively well.       Objective:   Physical  Exam  Filed Vitals:   01/01/14 1429  BP: 130/90  Pulse: 110  Temp: 98 F (36.7 C)   Blood pressure recheck:  120/82, pulse 5592  [age] year old female in no acute distress.   HEENT:  Nares- clear.  Oropharynx - without lesions. NECK:  Supple.  Nontender.  No audible bruit.  HEART:  Appears to be regular. LUNGS:  No crackles or wheezing audible.  Respirations even and unlabored.  RADIAL PULSE:  Equal bilaterally.    BREASTS:  No nipple discharge or nipple retraction present.  Could not appreciate any distinct nodules or axillary adenopathy.  ABDOMEN:  Soft, nontender.  Bowel sounds present and normal.  No audible abdominal bruit.  GU:  Normal external genitalia.  Vaginal vault without lesions.  Cervix identified.  Pap performed. Cervical polyp present.  Could not appreciate any adnexal masses or tenderness.   RECTAL:  Heme negative.   EXTREMITIES:  No increased edema present.  DP pulses palpable and equal bilaterally.          Assessment & Plan:  HEALTH MAINTENANCE.  Physical today.  Schedule mammogram.   Last mammogram 06/09/12 - ok (per her report).  Last colonoscopy 11/2010.  Recommended f/u 10 years.    I spent 25 minutes with the patient and more than 50% of  the time was spent in consultation regarding the above.

## 2014-01-01 NOTE — Assessment & Plan Note (Signed)
On thyroid replacement.  Follow tsh.  

## 2014-01-01 NOTE — Patient Instructions (Signed)
Take zantac (ranitidine0 150mg  - one per day - 30 minutes before breakfast.   Flonase nasal spray - two sprays each nostril one time per day.  Use this in the evening

## 2014-01-02 ENCOUNTER — Other Ambulatory Visit (HOSPITAL_COMMUNITY)
Admission: RE | Admit: 2014-01-02 | Discharge: 2014-01-02 | Disposition: A | Discharge status: Home / Self Care | Payer: Federal, State, Local not specified - PPO | Source: Ambulatory Visit | Attending: Internal Medicine | Admitting: Internal Medicine

## 2014-01-02 DIAGNOSIS — Z1151 Encounter for screening for human papillomavirus (HPV): Secondary | ICD-10-CM | POA: Insufficient documentation

## 2014-01-02 DIAGNOSIS — Z01419 Encounter for gynecological examination (general) (routine) without abnormal findings: Secondary | ICD-10-CM | POA: Insufficient documentation

## 2014-01-04 ENCOUNTER — Encounter: Payer: Self-pay | Admitting: *Deleted

## 2014-01-07 ENCOUNTER — Encounter: Payer: Self-pay | Admitting: Internal Medicine

## 2014-01-07 DIAGNOSIS — N841 Polyp of cervix uteri: Secondary | ICD-10-CM | POA: Insufficient documentation

## 2014-01-07 NOTE — Assessment & Plan Note (Signed)
Cervical polyp noticed on exam.  Contact [facility].  See if present previously.  Pt desired this first - prior to referral.

## 2014-01-07 NOTE — Assessment & Plan Note (Signed)
No sob.  Some increased cough with exercise.  Discussed using a rescue inhaler prior to exercise.  Follow.

## 2014-01-12 ENCOUNTER — Ambulatory Visit: Payer: Self-pay | Admitting: Internal Medicine

## 2014-01-12 LAB — HM MAMMOGRAPHY

## 2014-01-19 ENCOUNTER — Encounter: Payer: Self-pay | Admitting: Internal Medicine

## 2014-01-22 ENCOUNTER — Telehealth: Payer: Self-pay | Admitting: Internal Medicine

## 2014-01-22 NOTE — Telephone Encounter (Signed)
The patient is wanting her mammogram results. She had the mammogram done on 2.17.15

## 2014-01-23 ENCOUNTER — Encounter: Payer: Self-pay | Admitting: Internal Medicine

## 2014-01-23 NOTE — Telephone Encounter (Signed)
LMTCB, I spoke with [facility] & they state that she never came back for additional views

## 2014-01-23 NOTE — Telephone Encounter (Signed)
In reviewing the chart it appears she had the original mammo on 01/12/14 and they recommended f/u views.  Apparently the f/u views were done on 01/16/14.   I do not have this in my papers that I have.  Need results.  Please let pt know I have not received yet.  Can request.  Thanks.

## 2014-01-24 ENCOUNTER — Encounter: Payer: Self-pay | Admitting: Internal Medicine

## 2014-01-24 ENCOUNTER — Ambulatory Visit: Payer: Self-pay | Admitting: Internal Medicine

## 2014-01-24 ENCOUNTER — Encounter: Payer: Self-pay | Admitting: *Deleted

## 2014-01-24 LAB — HM MAMMOGRAPHY

## 2014-01-24 NOTE — Telephone Encounter (Signed)
Sent mychart message

## 2014-01-24 NOTE — Telephone Encounter (Signed)
Additional views were done today & results in your folder

## 2014-01-24 NOTE — Telephone Encounter (Signed)
Notify pt that her f/u mammogram revealed changes that appeared to be benign.  They recommended a 6 month f/u mammogram.  I can see her for a f/u breast exam to examine and make sure that I feel nothing different.  Will also need to schedule a 6 month right mammogram.  Let me know if she declines f/u appt with me.

## 2014-02-14 ENCOUNTER — Telehealth: Payer: Self-pay | Admitting: Internal Medicine

## 2014-02-14 NOTE — Telephone Encounter (Signed)
My chart message sent to pt about referral to gyn for cervical polyp.

## 2014-03-02 ENCOUNTER — Ambulatory Visit: Payer: Federal, State, Local not specified - PPO | Admitting: Internal Medicine

## 2014-06-17 ENCOUNTER — Other Ambulatory Visit: Payer: Self-pay | Admitting: Internal Medicine

## 2014-06-17 ENCOUNTER — Telehealth: Payer: Self-pay | Admitting: Internal Medicine

## 2014-06-17 DIAGNOSIS — R928 Other abnormal and inconclusive findings on diagnostic imaging of breast: Secondary | ICD-10-CM

## 2014-06-17 NOTE — Telephone Encounter (Signed)
Pt needs a f/u appt with me within the next 6-8 weeks (30 min).  Please schedule and contact her with an appt date and time. Thanks.  (document - may need f/u breast exam and pelvic on schedule).  Thanks.  Dr Lorin PicketScott

## 2014-06-17 NOTE — Progress Notes (Signed)
Order placed for diagnostic right mammo.

## 2014-07-18 ENCOUNTER — Telehealth: Payer: Self-pay | Admitting: Internal Medicine

## 2014-07-18 NOTE — Telephone Encounter (Signed)
[facility] diagnostic [facility] right  Left patient a voice mail regarding her follow up visit with [facility]./[phone]

## 2014-07-20 NOTE — Telephone Encounter (Signed)
Sent the patient a my chart message for her mammogram appointment on 9.11.15 @ 10:00.

## 2014-07-23 ENCOUNTER — Encounter: Payer: Self-pay | Admitting: Internal Medicine

## 2014-08-10 ENCOUNTER — Ambulatory Visit: Payer: Self-pay | Admitting: Internal Medicine

## 2014-08-10 LAB — HM MAMMOGRAPHY: HM Mammogram: NEGATIVE

## 2014-08-13 ENCOUNTER — Encounter: Payer: Self-pay | Admitting: Internal Medicine

## 2014-08-16 ENCOUNTER — Encounter: Payer: Self-pay | Admitting: Internal Medicine

## 2014-09-03 ENCOUNTER — Ambulatory Visit: Payer: Federal, State, Local not specified - PPO | Admitting: Internal Medicine

## 2015-03-01 ENCOUNTER — Other Ambulatory Visit: Payer: Self-pay | Admitting: Obstetrics and Gynecology

## 2015-03-04 LAB — CYTOLOGY - PAP

## 2015-03-11 LAB — HM MAMMOGRAPHY: HM MAMMO: NEGATIVE

## 2015-03-27 ENCOUNTER — Telehealth: Payer: Self-pay | Admitting: Internal Medicine

## 2015-03-27 ENCOUNTER — Encounter: Payer: Self-pay | Admitting: *Deleted

## 2015-03-27 NOTE — Telephone Encounter (Signed)
Notify pt that she is overdue mammogram.  If agreeable, I would like to schedule.  Also overdue a physical.  Please schedule.  Thanks

## 2015-03-27 NOTE — Telephone Encounter (Signed)
Sent mychart message

## 2015-03-29 NOTE — Telephone Encounter (Signed)
Unread mychart message mailed to patient 

## 2015-05-07 ENCOUNTER — Telehealth: Payer: Self-pay | Admitting: Internal Medicine

## 2015-05-07 NOTE — Telephone Encounter (Signed)
Pt has my chart, but please call her and notify her that she did not follow through with mammogram that was due in 12/2014.  Needs to schedule or we need to schedule.  Document if refuses.  If agreeable, let me know and I will place the order.  A message was sent to her earlier, but she never read the my chart.  (she was sent a letter, but never responded).

## 2015-05-08 ENCOUNTER — Other Ambulatory Visit: Payer: Self-pay | Admitting: *Deleted

## 2015-05-08 NOTE — Telephone Encounter (Signed)
Pt states that she had her mammogram completed on 03/01/15 @ [facility]. Records requested

## 2015-05-15 ENCOUNTER — Encounter: Payer: Self-pay | Admitting: Internal Medicine

## 2015-05-15 DIAGNOSIS — Z0000 Encounter for general adult medical examination without abnormal findings: Secondary | ICD-10-CM | POA: Insufficient documentation

## 2015-12-24 ENCOUNTER — Encounter: Payer: Self-pay | Admitting: Internal Medicine

## 2016-09-22 DIAGNOSIS — Z23 Encounter for immunization: Secondary | ICD-10-CM | POA: Diagnosis not present

## 2017-01-11 DIAGNOSIS — K080 Exfoliation of teeth due to systemic causes: Secondary | ICD-10-CM | POA: Diagnosis not present

## 2017-07-08 ENCOUNTER — Telehealth: Payer: Self-pay | Admitting: Internal Medicine

## 2017-07-08 NOTE — Telephone Encounter (Signed)
Pt called and stated that they would like to re establish with Dr. Lorin PicketScott, pt's mom is Georgiann MohsBarbara Lowe. Please advise, thank you!

## 2017-07-08 NOTE — Telephone Encounter (Signed)
Ok to re-establish 

## 2017-07-08 NOTE — Telephone Encounter (Signed)
Can we make app?

## 2017-07-16 NOTE — Telephone Encounter (Signed)
Pt is scheduled for 11/02/17

## 2017-08-17 DIAGNOSIS — Z13820 Encounter for screening for osteoporosis: Secondary | ICD-10-CM | POA: Diagnosis not present

## 2017-08-17 DIAGNOSIS — E039 Hypothyroidism, unspecified: Secondary | ICD-10-CM | POA: Diagnosis not present

## 2017-08-17 DIAGNOSIS — Z6824 Body mass index (BMI) 24.0-24.9, adult: Secondary | ICD-10-CM | POA: Diagnosis not present

## 2017-08-17 DIAGNOSIS — N958 Other specified menopausal and perimenopausal disorders: Secondary | ICD-10-CM | POA: Diagnosis not present

## 2017-08-17 DIAGNOSIS — Z1231 Encounter for screening mammogram for malignant neoplasm of breast: Secondary | ICD-10-CM | POA: Diagnosis not present

## 2017-08-17 DIAGNOSIS — M816 Localized osteoporosis [Lequesne]: Secondary | ICD-10-CM | POA: Diagnosis not present

## 2017-08-17 DIAGNOSIS — Z01419 Encounter for gynecological examination (general) (routine) without abnormal findings: Secondary | ICD-10-CM | POA: Diagnosis not present

## 2017-08-17 DIAGNOSIS — M810 Age-related osteoporosis without current pathological fracture: Secondary | ICD-10-CM | POA: Diagnosis not present

## 2017-08-17 DIAGNOSIS — Z23 Encounter for immunization: Secondary | ICD-10-CM | POA: Diagnosis not present

## 2017-11-02 ENCOUNTER — Encounter: Payer: Self-pay | Admitting: Internal Medicine

## 2017-11-02 ENCOUNTER — Ambulatory Visit (INDEPENDENT_AMBULATORY_CARE_PROVIDER_SITE_OTHER): Payer: Federal, State, Local not specified - PPO | Admitting: Internal Medicine

## 2017-11-02 VITALS — BP 130/82 | HR 103 | Temp 98.2°F | Resp 18 | Ht 63.98 in | Wt 149.2 lb

## 2017-11-02 DIAGNOSIS — R053 Chronic cough: Secondary | ICD-10-CM

## 2017-11-02 DIAGNOSIS — Z1211 Encounter for screening for malignant neoplasm of colon: Secondary | ICD-10-CM

## 2017-11-02 DIAGNOSIS — Z124 Encounter for screening for malignant neoplasm of cervix: Secondary | ICD-10-CM | POA: Diagnosis not present

## 2017-11-02 DIAGNOSIS — R05 Cough: Secondary | ICD-10-CM | POA: Diagnosis not present

## 2017-11-02 DIAGNOSIS — J4520 Mild intermittent asthma, uncomplicated: Secondary | ICD-10-CM

## 2017-11-02 DIAGNOSIS — L989 Disorder of the skin and subcutaneous tissue, unspecified: Secondary | ICD-10-CM

## 2017-11-02 DIAGNOSIS — E039 Hypothyroidism, unspecified: Secondary | ICD-10-CM | POA: Diagnosis not present

## 2017-11-02 LAB — TSH: TSH: 1.83 u[IU]/mL (ref 0.35–4.50)

## 2017-11-02 NOTE — Progress Notes (Signed)
Patient ID: Amanda [name] Cupp, female   DOB: 02/10/1956, 61 y.o.   MRN: [id number]   Subjective:    Patient ID: Amanda [name] Weidinger, female    DOB: 07/13/1956, 61 y.o.   MRN: [id number]  HPI  Patient here to reestablish care.  I last saw her 12/2013.  She is on thyroid medication.  States was evaluated by gyn 07/2017.  Up to date.  Had synthroid adjusted then.  Due f/u tsh.  Reports has been doing well.  Tries to stay active. No chest pain.  No sob.  Persistent cough.  Notices more in am and if cold and exercising.  Has had extensive w/up.  See previous note.  No chest congestion.  No sob.  Had flu shot in 07/2017.  Waiting for shingles vaccine.  She does have a lesion on her right shoulder.  Persistent.  Request referral to Dr Roseanne KaufmanIsenstein.  Overall she feels she is doing relatively well.     Past Medical History:  Diagnosis Date  . Asthma   . Diverticulitis    H/O  . History of chicken pox   . Hypothyroidism    Past Surgical History:  Procedure Laterality Date  . COLON SURGERY  06/15/05  . DERMOID CYST  EXCISION     Family History  Problem Relation Age of Onset  . Arthritis Mother   . Hyperlipidemia Mother   . Hypertension Mother   . Arthritis Father   . Hypertension Father   . Breast cancer Maternal Aunt   . Arthritis Paternal Grandmother    Social History   Socioeconomic History  . Marital status: Married    Spouse name: None  . Number of children: 3  . Years of education: None  . Highest education level: None  Social Needs  . Financial resource strain: None  . Food insecurity - worry: None  . Food insecurity - inability: None  . Transportation needs - medical: None  . Transportation needs - non-medical: None  Occupational History    Employer: [employer]  Tobacco Use  . Smoking status: Never Smoker  . Smokeless tobacco: Never Used  Substance and Sexual Activity  . Alcohol use: Yes    Comment: seldom  . Drug use: No  . Sexual activity: None  Other Topics Concern   . None  Social History Narrative  . None    Outpatient Encounter Medications as of 11/02/2017  Medication Sig  . levothyroxine (SYNTHROID, LEVOTHROID) 75 MCG tablet   . [DISCONTINUED] fluticasone (FLONASE) 50 MCG/ACT nasal spray Place 2 sprays into both nostrils daily.  . [DISCONTINUED] levothyroxine (SYNTHROID, LEVOTHROID) 88 MCG tablet Take 1 tablet (88 mcg total) by mouth daily before breakfast.   No facility-administered encounter medications on file as of 11/02/2017.     Review of Systems  Constitutional: Negative for appetite change and unexpected weight change.  HENT: Negative for congestion and sinus pressure.   Respiratory: Positive for cough. Negative for chest tightness and shortness of breath.   Cardiovascular: Negative for chest pain, palpitations and leg swelling.  Gastrointestinal: Negative for abdominal pain, diarrhea, nausea and vomiting.  Genitourinary: Negative for difficulty urinating and dysuria.  Musculoskeletal: Negative for back pain and joint swelling.  Skin: Negative for color change and rash.       Persistent lesion right shoulder.   Neurological: Negative for dizziness, light-headedness and headaches.  Psychiatric/Behavioral: Negative for agitation and dysphoric mood.       Objective:    Physical Exam  Constitutional: She  appears well-developed and well-nourished. No distress.  HENT:  Nose: Nose normal.  Mouth/Throat: Oropharynx is clear and moist.  Neck: Neck supple. No thyromegaly present.  Cardiovascular: Normal rate and regular rhythm.  Pulmonary/Chest: Breath sounds normal. No respiratory distress. She has no wheezes.  Abdominal: Soft. Bowel sounds are normal. There is no tenderness.  Musculoskeletal: She exhibits no edema or tenderness.  Lymphadenopathy:    She has no cervical adenopathy.  Skin: No rash noted. No erythema.  Psychiatric: She has a normal mood and affect. Her behavior is normal.    BP 130/82   Pulse (!) 103   Temp 98.2  F (36.8 C)   Resp 18   Ht 5' 3.98" (1.625 m)   Wt 149 lb 4 oz (67.7 kg)   LMP 05/01/2011   SpO2 98%   BMI 25.64 kg/m  Wt Readings from Last 3 Encounters:  11/02/17 149 lb 4 oz (67.7 kg)  01/01/14 143 lb 12 oz (65.2 kg)  09/04/13 140 lb (63.5 kg)     Lab Results  Component Value Date   WBC 4.4 ([name]) 09/22/2013   HGB 14.1 09/22/2013   HCT 41.4 09/22/2013   PLT 265.0 09/22/2013   GLUCOSE 95 09/04/2013   CHOL 201 (H) 09/04/2013   TRIG 75.0 09/04/2013   HDL 70.40 09/04/2013   LDLDIRECT 109.2 09/04/2013   ALT 20 09/04/2013   AST 18 09/04/2013   NA 135 09/04/2013   K 4.1 09/04/2013   CL 103 09/04/2013   CREATININE 0.9 09/04/2013   BUN 13 09/04/2013   CO2 30 09/04/2013   TSH 1.83 11/02/2017       Assessment & Plan:   Problem List Items Addressed This Visit    Asthma    Overall feels breathing is stable.  Follow.        Cervical cancer screening    Up to date.  Sees gyn.  Evaluated 07/2017. Had mammogram as well.        Chronic cough    Has had extensive w/up.  Has been treated for allergies, GERD and asthma with incomplete resolution.  Has been told related to asthma.  Had previous cxr - ok.  Has seen specialist.  Overall feels is stable.  Follow.        Colon cancer screening    Previous saw Dr Leone PayorGessner.  Last colonoscopy 11/2010.  States recommended f/u in 10 years.        Hypothyroidism - Primary    On thyroid replacement.  Follow tsh.        Relevant Medications   levothyroxine (SYNTHROID, LEVOTHROID) 75 MCG tablet   Other Relevant Orders   TSH (Completed)    Other Visit Diagnoses    Skin lesion       Persistent.  refer to dermatology.  wants to see Dr Roseanne KaufmanIsenstein.    Relevant Orders   Ambulatory referral to Dermatology       Dale DurhamSCOTT, Lillianah Swartzentruber, MD

## 2017-11-03 ENCOUNTER — Encounter: Payer: Self-pay | Admitting: Internal Medicine

## 2017-11-05 ENCOUNTER — Encounter: Payer: Self-pay | Admitting: Internal Medicine

## 2017-11-05 DIAGNOSIS — Z1211 Encounter for screening for malignant neoplasm of colon: Secondary | ICD-10-CM | POA: Insufficient documentation

## 2017-11-05 DIAGNOSIS — Z124 Encounter for screening for malignant neoplasm of cervix: Secondary | ICD-10-CM | POA: Insufficient documentation

## 2017-11-05 NOTE — Assessment & Plan Note (Signed)
Has had extensive w/up.  Has been treated for allergies, GERD and asthma with incomplete resolution.  Has been told related to asthma.  Had previous cxr - ok.  Has seen specialist.  Overall feels is stable.  Follow.

## 2017-11-05 NOTE — Telephone Encounter (Signed)
Copy of lab results mailed to patient.

## 2017-11-05 NOTE — Assessment & Plan Note (Signed)
Previous saw Dr Leone PayorGessner.  Last colonoscopy 11/2010.  States recommended f/u in 10 years.

## 2017-11-05 NOTE — Assessment & Plan Note (Signed)
On thyroid replacement.  Follow tsh.  

## 2017-11-05 NOTE — Assessment & Plan Note (Signed)
Up to date.  Sees gyn.  Evaluated 07/2017. Had mammogram as well.

## 2017-11-05 NOTE — Assessment & Plan Note (Signed)
Overall feels breathing is stable.  Follow.

## 2018-02-24 ENCOUNTER — Telehealth: Payer: Self-pay | Admitting: Internal Medicine

## 2018-02-24 ENCOUNTER — Other Ambulatory Visit: Payer: Self-pay

## 2018-02-24 NOTE — Telephone Encounter (Unsigned)
Copied from CRM [id number]. Topic: Quick Communication - Rx Refill/Question >> Feb 24, 2018  2:21 PM Floria RavelingStovall, Shana A wrote: Medication:  levothyroxine (SYNTHROID, LEVOTHROID) 75 MCG tablet [[id number]][[id number]]  Has the patient contacted their pharmacy? No  (Agent: If no, request that the patient contact the pharmacy for the refill.) Preferred Pharmacy (with phone number [facility] street [facility] [place] [facility]  Agent: Please be advised that RX refills may take up to 3 business days. We ask that you follow-up with your pharmacy.

## 2018-02-24 NOTE — Telephone Encounter (Signed)
Request for 90 day refill of Levothyroxine (Synthroid, Levothroid) 75mcg tab. Medication noted to be filled by historical provider.   LOV: 11/02/17  Dr. Festus BarrenScott  [facility] [place],[address] on [address]

## 2018-02-24 NOTE — Telephone Encounter (Signed)
Patient stated that we were going to start following thyroid. Told patient it was too early to fill according to [facility]. Patient stated that was fine. She got it filled for 30 days on 02/08/2018. Patient is requesting to have filled for rest of year by Dr. Lorin PicketScott. Patient stated that this request was not urgent and would call back in April for refill if she needed to. She just wanted to fix the confusion of who should  Be filling.

## 2018-02-25 MED ORDER — LEVOTHYROXINE SODIUM 75 MCG PO TABS: 75.0000 ug | ORAL_TABLET | Freq: Every day | ORAL | 1 refills | Status: DC

## 2018-02-25 NOTE — Telephone Encounter (Signed)
I have sent in rx for her thyroid medication (#90 with one refill).

## 2018-05-03 ENCOUNTER — Ambulatory Visit: Payer: Federal, State, Local not specified - PPO | Admitting: Internal Medicine

## 2018-06-22 DIAGNOSIS — D485 Neoplasm of uncertain behavior of skin: Secondary | ICD-10-CM | POA: Diagnosis not present

## 2018-06-22 DIAGNOSIS — D2272 Melanocytic nevi of left lower limb, including hip: Secondary | ICD-10-CM | POA: Diagnosis not present

## 2018-06-22 DIAGNOSIS — D2261 Melanocytic nevi of right upper limb, including shoulder: Secondary | ICD-10-CM | POA: Diagnosis not present

## 2018-06-22 DIAGNOSIS — D225 Melanocytic nevi of trunk: Secondary | ICD-10-CM | POA: Diagnosis not present

## 2018-06-22 DIAGNOSIS — D2262 Melanocytic nevi of left upper limb, including shoulder: Secondary | ICD-10-CM | POA: Diagnosis not present

## 2018-06-22 DIAGNOSIS — L570 Actinic keratosis: Secondary | ICD-10-CM | POA: Diagnosis not present

## 2018-07-08 DIAGNOSIS — L570 Actinic keratosis: Secondary | ICD-10-CM | POA: Diagnosis not present

## 2018-09-13 ENCOUNTER — Ambulatory Visit (INDEPENDENT_AMBULATORY_CARE_PROVIDER_SITE_OTHER): Payer: Federal, State, Local not specified - PPO

## 2018-09-13 DIAGNOSIS — Z23 Encounter for immunization: Secondary | ICD-10-CM

## 2018-09-20 DIAGNOSIS — Z1231 Encounter for screening mammogram for malignant neoplasm of breast: Secondary | ICD-10-CM | POA: Diagnosis not present

## 2018-09-20 DIAGNOSIS — Z6824 Body mass index (BMI) 24.0-24.9, adult: Secondary | ICD-10-CM | POA: Diagnosis not present

## 2018-09-20 DIAGNOSIS — Z01419 Encounter for gynecological examination (general) (routine) without abnormal findings: Secondary | ICD-10-CM | POA: Diagnosis not present

## 2018-09-22 ENCOUNTER — Other Ambulatory Visit: Payer: Self-pay | Admitting: Internal Medicine

## 2018-10-03 ENCOUNTER — Other Ambulatory Visit: Payer: Self-pay | Admitting: *Deleted

## 2018-10-03 ENCOUNTER — Inpatient Hospital Stay
Admission: RE | Admit: 2018-10-03 | Discharge: 2018-10-03 | Disposition: A | Discharge status: Home / Self Care | Payer: Self-pay | Source: Ambulatory Visit | Attending: *Deleted | Admitting: *Deleted

## 2018-10-03 DIAGNOSIS — Z9289 Personal history of other medical treatment: Secondary | ICD-10-CM

## 2018-10-04 ENCOUNTER — Other Ambulatory Visit: Payer: Self-pay | Admitting: Obstetrics and Gynecology

## 2018-10-04 DIAGNOSIS — Z803 Family history of malignant neoplasm of breast: Secondary | ICD-10-CM

## 2018-10-04 DIAGNOSIS — Z1501 Genetic susceptibility to malignant neoplasm of breast: Secondary | ICD-10-CM

## 2018-10-11 DIAGNOSIS — Z13220 Encounter for screening for lipoid disorders: Secondary | ICD-10-CM | POA: Diagnosis not present

## 2018-10-11 DIAGNOSIS — E039 Hypothyroidism, unspecified: Secondary | ICD-10-CM | POA: Diagnosis not present

## 2018-10-11 DIAGNOSIS — Z13228 Encounter for screening for other metabolic disorders: Secondary | ICD-10-CM | POA: Diagnosis not present

## 2018-10-11 DIAGNOSIS — Z1321 Encounter for screening for nutritional disorder: Secondary | ICD-10-CM | POA: Diagnosis not present

## 2018-12-20 DIAGNOSIS — Z803 Family history of malignant neoplasm of breast: Secondary | ICD-10-CM | POA: Diagnosis not present

## 2018-12-20 DIAGNOSIS — E559 Vitamin D deficiency, unspecified: Secondary | ICD-10-CM | POA: Diagnosis not present

## 2018-12-20 DIAGNOSIS — Z9189 Other specified personal risk factors, not elsewhere classified: Secondary | ICD-10-CM | POA: Diagnosis not present

## 2019-04-04 ENCOUNTER — Encounter: Payer: Federal, State, Local not specified - PPO | Admitting: Internal Medicine

## 2019-07-07 ENCOUNTER — Other Ambulatory Visit: Payer: Self-pay

## 2019-07-11 ENCOUNTER — Encounter: Payer: Self-pay | Admitting: Internal Medicine

## 2019-07-11 ENCOUNTER — Encounter: Payer: Federal, State, Local not specified - PPO | Admitting: Internal Medicine

## 2019-07-11 NOTE — Telephone Encounter (Signed)
appt was cancelled

## 2019-08-22 DIAGNOSIS — K080 Exfoliation of teeth due to systemic causes: Secondary | ICD-10-CM | POA: Diagnosis not present

## 2019-09-22 ENCOUNTER — Other Ambulatory Visit: Payer: Self-pay

## 2019-09-26 ENCOUNTER — Encounter: Payer: Self-pay | Admitting: Internal Medicine

## 2019-09-26 ENCOUNTER — Ambulatory Visit (INDEPENDENT_AMBULATORY_CARE_PROVIDER_SITE_OTHER): Payer: Federal, State, Local not specified - PPO | Admitting: Internal Medicine

## 2019-09-26 ENCOUNTER — Other Ambulatory Visit: Payer: Self-pay

## 2019-09-26 DIAGNOSIS — J4520 Mild intermittent asthma, uncomplicated: Secondary | ICD-10-CM

## 2019-09-26 DIAGNOSIS — R109 Unspecified abdominal pain: Secondary | ICD-10-CM

## 2019-09-26 DIAGNOSIS — Z124 Encounter for screening for malignant neoplasm of cervix: Secondary | ICD-10-CM

## 2019-09-26 DIAGNOSIS — Z1211 Encounter for screening for malignant neoplasm of colon: Secondary | ICD-10-CM

## 2019-09-26 DIAGNOSIS — E039 Hypothyroidism, unspecified: Secondary | ICD-10-CM

## 2019-09-26 DIAGNOSIS — L659 Nonscarring hair loss, unspecified: Secondary | ICD-10-CM

## 2019-09-26 DIAGNOSIS — K219 Gastro-esophageal reflux disease without esophagitis: Secondary | ICD-10-CM

## 2019-09-26 NOTE — Progress Notes (Signed)
Patient ID: Amanda [name], female   DOB: 05/30/1956, 63 y.o.   MRN: [id number]   Subjective:    Patient ID: Amanda GrimNancy L [name], female    DOB: 12/02/1955, 63 y.o.   MRN: [id number]  HPI  Patient here for a scheduled physical exam. She sees gyn and they do her pelvic and breast exams.  Need records.  Since she gets her physicals at gyn, will change this appt to follow up appt.  Requested.  Had colonoscopy 11/2010.  States f/u 10 years - Dr Leone PayorGessner.  Has several concerns today.  Concerned about hair loss.  Persistent.  Also concerned about varicose veins.  Discussed compression hose.  No chest pain.  No sob.  Some acid reflux.  Discussed taking pepcid on a regular basis.  Discussed the need to get this under control.  No abdominal pain.  Bowels moving.  Had flare with her abdomen.  She relates to diverticulitis.  Not a significant issue for her now.  Discussed further w/up.  She wants to hold.  Palpable small raised area - foot and leg.  She wants to monitor.     Past Medical History:  Diagnosis Date  . Asthma   . Diverticulitis    H/O  . History of chicken pox   . Hypothyroidism    Past Surgical History:  Procedure Laterality Date  . COLON SURGERY  06/15/05  . DERMOID CYST  EXCISION     Family History  Problem Relation Age of Onset  . Arthritis Mother   . Hyperlipidemia Mother   . Hypertension Mother   . Arthritis Father   . Hypertension Father   . Breast cancer Maternal Aunt   . Arthritis Paternal Grandmother    Social History   Socioeconomic History  . Marital status: Married    Spouse name: Not on file  . Number of children: 3  . Years of education: Not on file  . Highest education level: Not on file  Occupational History    Employer: [employer]  Social Needs  . Financial resource strain: Not on file  . Food insecurity    Worry: Not on file    Inability: Not on file  . Transportation needs    Medical: Not on file    Non-medical: Not on file  Tobacco Use  .  Smoking status: Never Smoker  . Smokeless tobacco: Never Used  Substance and Sexual Activity  . Alcohol use: Yes    Comment: seldom  . Drug use: No  . Sexual activity: Not on file  Lifestyle  . Physical activity    Days per week: Not on file    Minutes per session: Not on file  . Stress: Not on file  Relationships  . Social Musicianconnections    Talks on phone: Not on file    Gets together: Not on file    Attends religious service: Not on file    Active member of club or organization: Not on file    Attends meetings of clubs or organizations: Not on file    Relationship status: Not on file  Other Topics Concern  . Not on file  Social History Narrative  . Not on file    Outpatient Encounter Medications as of 09/26/2019  Medication Sig  . levothyroxine (SYNTHROID, LEVOTHROID) 75 MCG tablet TAKE 1 TABLET BY MOUTH ONCE DAILY BEFORE BREAKFAST   No facility-administered encounter medications on file as of 09/26/2019.    Review of Systems  Constitutional: Negative for  appetite change and unexpected weight change.  HENT: Negative for congestion and sinus pressure.   Eyes: Negative for pain and visual disturbance.  Respiratory: Negative for cough, chest tightness and shortness of breath.   Cardiovascular: Negative for chest pain, palpitations and leg swelling.  Gastrointestinal: Negative for abdominal pain, diarrhea, nausea and vomiting.       Previous "flare" as outlined.    Genitourinary: Negative for difficulty urinating and dysuria.  Musculoskeletal: Negative for joint swelling and myalgias.  Skin: Negative for color change and rash.  Neurological: Negative for dizziness, light-headedness and headaches.  Hematological: Negative for adenopathy. Does not bruise/bleed easily.  Psychiatric/Behavioral: Negative for agitation and dysphoric mood.       Objective:    Physical Exam Constitutional:      General: She is not in acute distress.    Appearance: Normal appearance.  HENT:      Head: Normocephalic and atraumatic.     Right Ear: External ear normal.     Left Ear: External ear normal.  Eyes:     General: No scleral icterus.       Right eye: No discharge.        Left eye: No discharge.     Conjunctiva/sclera: Conjunctivae normal.  Neck:     Musculoskeletal: Neck supple. No muscular tenderness.     Thyroid: No thyromegaly.  Cardiovascular:     Rate and Rhythm: Normal rate and regular rhythm.  Pulmonary:     Effort: No respiratory distress.     Breath sounds: Normal breath sounds. No wheezing.  Abdominal:     General: Bowel sounds are normal.     Palpations: Abdomen is soft.     Tenderness: There is no abdominal tenderness.  Musculoskeletal:        General: No swelling or tenderness.  Lymphadenopathy:     Cervical: No cervical adenopathy.  Skin:    Findings: No erythema or rash.  Neurological:     Mental Status: She is alert.  Psychiatric:        Behavior: Behavior normal.     BP 136/78   Pulse 95   Temp (!) 96.7 F (35.9 C)   Resp 16   Ht 5\' 5"  (1.651 m)   Wt 145 lb 12.8 oz (66.1 kg)   LMP 05/01/2011   SpO2 98%   BMI 24.26 kg/m  Wt Readings from Last 3 Encounters:  09/26/19 145 lb 12.8 oz (66.1 kg)  11/02/17 149 lb 4 oz (67.7 kg)  01/01/14 143 lb 12 oz (65.2 kg)     Lab Results  Component Value Date   WBC 4.4 (L) 09/22/2013   HGB 14.1 09/22/2013   HCT 41.4 09/22/2013   PLT 265.0 09/22/2013   GLUCOSE 95 09/04/2013   CHOL 201 (H) 09/04/2013   TRIG 75.0 09/04/2013   HDL 70.40 09/04/2013   LDLDIRECT 109.2 09/04/2013   ALT 20 09/04/2013   AST 18 09/04/2013   NA 135 09/04/2013   K 4.1 09/04/2013   CL 103 09/04/2013   CREATININE 0.9 09/04/2013   BUN 13 09/04/2013   CO2 30 09/04/2013   TSH 1.83 11/02/2017       Assessment & Plan:   Problem List Items Addressed This Visit    Abdominal pain    No significant pain today on exam.  She declines further w/up.  Due colonoscopy soon as outlined.  Refer for evaluation.        Asthma    Breathing stable.  Cervical cancer screening    Sees gyn.  States up to date.  Obtain records from [place].        Colon cancer screening    Previously saw Dr Carlean Purl.  Last colonoscopy 11/2010.  Due f/u soon.  Refer to GI.       Relevant Orders   Ambulatory referral to Gastroenterology   GERD (gastroesophageal reflux disease)    Discussed pepcid daily.  Follow.       Hair loss    Discussed with her today.  Needs labs.  Discussed dermatology evaluation.  States gets labs through gyn.  Obtain results.        Hypothyroidism    On thyroid replacement.  Follow tsh.            Einar Pheasant, MD

## 2019-10-01 ENCOUNTER — Encounter: Payer: Self-pay | Admitting: Internal Medicine

## 2019-10-01 DIAGNOSIS — K219 Gastro-esophageal reflux disease without esophagitis: Secondary | ICD-10-CM | POA: Insufficient documentation

## 2019-10-01 DIAGNOSIS — L659 Nonscarring hair loss, unspecified: Secondary | ICD-10-CM | POA: Insufficient documentation

## 2019-10-01 DIAGNOSIS — R109 Unspecified abdominal pain: Secondary | ICD-10-CM | POA: Insufficient documentation

## 2019-10-01 NOTE — Assessment & Plan Note (Signed)
Discussed pepcid daily.  Follow.

## 2019-10-01 NOTE — Assessment & Plan Note (Signed)
Previously saw Dr Carlean Purl.  Last colonoscopy 11/2010.  Due f/u soon.  Refer to GI.

## 2019-10-01 NOTE — Assessment & Plan Note (Signed)
No significant pain today on exam.  She declines further w/up.  Due colonoscopy soon as outlined.  Refer for evaluation.

## 2019-10-01 NOTE — Assessment & Plan Note (Signed)
Sees gyn.  States up to date.  Obtain records from [place].

## 2019-10-01 NOTE — Assessment & Plan Note (Signed)
Discussed with her today.  Needs labs.  Discussed dermatology evaluation.  States gets labs through gyn.  Obtain results.

## 2019-10-01 NOTE — Assessment & Plan Note (Signed)
Breathing stable.

## 2019-10-01 NOTE — Assessment & Plan Note (Signed)
On thyroid replacement.  Follow tsh.  

## 2019-11-17 DIAGNOSIS — D2372 Other benign neoplasm of skin of left lower limb, including hip: Secondary | ICD-10-CM | POA: Diagnosis not present

## 2019-11-17 DIAGNOSIS — L821 Other seborrheic keratosis: Secondary | ICD-10-CM | POA: Diagnosis not present

## 2020-04-16 DIAGNOSIS — Z1231 Encounter for screening mammogram for malignant neoplasm of breast: Secondary | ICD-10-CM | POA: Diagnosis not present

## 2020-04-16 DIAGNOSIS — Z13228 Encounter for screening for other metabolic disorders: Secondary | ICD-10-CM | POA: Diagnosis not present

## 2020-04-16 DIAGNOSIS — Z01419 Encounter for gynecological examination (general) (routine) without abnormal findings: Secondary | ICD-10-CM | POA: Diagnosis not present

## 2020-04-16 DIAGNOSIS — Z13220 Encounter for screening for lipoid disorders: Secondary | ICD-10-CM | POA: Diagnosis not present

## 2020-04-16 DIAGNOSIS — Z1329 Encounter for screening for other suspected endocrine disorder: Secondary | ICD-10-CM | POA: Diagnosis not present

## 2020-04-16 DIAGNOSIS — Z9189 Other specified personal risk factors, not elsewhere classified: Secondary | ICD-10-CM | POA: Diagnosis not present

## 2020-04-25 ENCOUNTER — Other Ambulatory Visit: Payer: Self-pay | Admitting: Obstetrics and Gynecology

## 2020-04-30 ENCOUNTER — Other Ambulatory Visit: Payer: Self-pay | Admitting: Obstetrics and Gynecology

## 2020-04-30 DIAGNOSIS — Z9189 Other specified personal risk factors, not elsewhere classified: Secondary | ICD-10-CM

## 2020-05-21 DIAGNOSIS — R899 Unspecified abnormal finding in specimens from other organs, systems and tissues: Secondary | ICD-10-CM | POA: Diagnosis not present

## 2020-05-21 DIAGNOSIS — R739 Hyperglycemia, unspecified: Secondary | ICD-10-CM | POA: Diagnosis not present

## 2020-05-21 DIAGNOSIS — Z13820 Encounter for screening for osteoporosis: Secondary | ICD-10-CM | POA: Diagnosis not present

## 2020-05-21 LAB — HM DEXA SCAN

## 2021-04-18 DIAGNOSIS — D225 Melanocytic nevi of trunk: Secondary | ICD-10-CM | POA: Diagnosis not present

## 2021-04-18 DIAGNOSIS — D2262 Melanocytic nevi of left upper limb, including shoulder: Secondary | ICD-10-CM | POA: Diagnosis not present

## 2021-04-18 DIAGNOSIS — D2261 Melanocytic nevi of right upper limb, including shoulder: Secondary | ICD-10-CM | POA: Diagnosis not present

## 2021-04-18 DIAGNOSIS — D2272 Melanocytic nevi of left lower limb, including hip: Secondary | ICD-10-CM | POA: Diagnosis not present

## 2021-07-02 ENCOUNTER — Encounter: Payer: Self-pay | Admitting: Internal Medicine

## 2022-03-13 LAB — HM MAMMOGRAPHY

## 2022-03-16 LAB — HM PAP SMEAR: HPV, high-risk: NEGATIVE

## 2023-07-13 ENCOUNTER — Telehealth: Payer: Self-pay | Admitting: Internal Medicine

## 2023-07-13 NOTE — Telephone Encounter (Signed)
Pt called wanting to reestablish care with the provider. Pt has not been seen since 08/2019

## 2023-07-14 NOTE — Telephone Encounter (Signed)
Are you ok with this? See me

## 2023-07-14 NOTE — Telephone Encounter (Signed)
Ok

## 2023-07-14 NOTE — Telephone Encounter (Signed)
LMTCB. Ok to schedule when patient returns call.

## 2023-07-15 NOTE — Telephone Encounter (Signed)
Noted  

## 2023-07-15 NOTE — Telephone Encounter (Signed)
Pt is scheduled °

## 2023-09-28 ENCOUNTER — Encounter: Payer: Self-pay | Admitting: Internal Medicine

## 2023-09-28 ENCOUNTER — Ambulatory Visit (INDEPENDENT_AMBULATORY_CARE_PROVIDER_SITE_OTHER): Payer: Medicare Other | Admitting: Internal Medicine

## 2023-09-28 VITALS — BP 122/70 | HR 84 | Temp 98.2°F | Resp 16 | Ht 65.0 in | Wt 133.0 lb

## 2023-09-28 DIAGNOSIS — Z1231 Encounter for screening mammogram for malignant neoplasm of breast: Secondary | ICD-10-CM

## 2023-09-28 DIAGNOSIS — Z1211 Encounter for screening for malignant neoplasm of colon: Secondary | ICD-10-CM

## 2023-09-28 DIAGNOSIS — R739 Hyperglycemia, unspecified: Secondary | ICD-10-CM | POA: Diagnosis not present

## 2023-09-28 DIAGNOSIS — E039 Hypothyroidism, unspecified: Secondary | ICD-10-CM | POA: Diagnosis not present

## 2023-09-28 DIAGNOSIS — Z13220 Encounter for screening for lipoid disorders: Secondary | ICD-10-CM

## 2023-09-28 DIAGNOSIS — R42 Dizziness and giddiness: Secondary | ICD-10-CM | POA: Diagnosis not present

## 2023-09-28 NOTE — Progress Notes (Signed)
Subjective:    Patient ID: Amanda [name], female    DOB: 03-25-1956, 67 y.o.   MRN: [id number]  Patient here for  Chief Complaint  Patient presents with   Establish Care    HPI Here to reestablish care.  I last saw her in 2020. Has been on thyroid medication.  Has seen gyn. Last note I have 03/2020. Per note had pap 03/2020. Need results. (Also question of pap 02/2022). Need records to review. Negative genetic testing. Tries to stay active.  No chest pain or sob reported.  No increased cough or congestion reported. Continued problems with her left ear.also persistent intermittent dizziness/room moving.  Minimal aching.  Request referral to ENT.  Has seen Dr Leone Payor previously for colonoscopy. Overdue.  Discussed.    Past Medical History:  Diagnosis Date   Asthma    Diverticulitis    H/O   History of chicken pox    Hypothyroidism    Past Surgical History:  Procedure Laterality Date   COLON SURGERY  06/15/05   DERMOID CYST  EXCISION     Family History  Problem Relation Age of Onset   Arthritis Mother    Hyperlipidemia Mother    Hypertension Mother    Arthritis Father    Hypertension Father    Breast cancer Maternal Aunt    Arthritis Paternal Grandmother    Social History   Socioeconomic History   Marital status: Married    Spouse name: Not on file   Number of children: 3   Years of education: Not on file   Highest education level: Not on file  Occupational History    Employer: [employer]  Tobacco Use   Smoking status: Never   Smokeless tobacco: Never  Substance and Sexual Activity   Alcohol use: Yes    Comment: seldom   Drug use: No   Sexual activity: Not on file  Other Topics Concern   Not on file  Social History Narrative   Not on file   Social Determinants of Health   Financial Resource Strain: Not on file  Food Insecurity: Not on file  Transportation Needs: Not on file  Physical Activity: Not on file  Stress: Not on file  Social  Connections: Not on file     Review of Systems  Constitutional:  Negative for appetite change and unexpected weight change.  HENT:  Negative for congestion, sinus pressure and sore throat.   Eyes:  Negative for pain and visual disturbance.  Respiratory:  Negative for cough, chest tightness and shortness of breath.   Cardiovascular:  Negative for chest pain and palpitations.  Gastrointestinal:  Negative for abdominal pain, diarrhea, nausea and vomiting.  Genitourinary:  Negative for difficulty urinating and dysuria.  Musculoskeletal:  Negative for back pain and joint swelling.  Skin:  Negative for color change and rash.  Neurological:  Negative for dizziness and headaches.  Hematological:  Negative for adenopathy. Does not bruise/bleed easily.  Psychiatric/Behavioral:  Negative for agitation and dysphoric mood.        Objective:     BP 122/70   Pulse 84   Temp 98.2 F (36.8 C)   Resp 16   Ht 5\' 5"  (1.651 m)   Wt 133 lb (60.3 kg)   LMP 05/01/2011   SpO2 98%   BMI 22.13 kg/m  Wt Readings from Last 3 Encounters:  09/28/23 133 lb (60.3 kg)  09/26/19 145 lb 12.8 oz (66.1 kg)  11/02/17 149 lb 4 oz (67.7  kg)    Physical Exam Vitals reviewed.  Constitutional:      General: She is not in acute distress.    Appearance: Normal appearance.  HENT:     Head: Normocephalic and atraumatic.     Right Ear: External ear normal.     Left Ear: External ear normal.  Eyes:     General: No scleral icterus.       Right eye: No discharge.        Left eye: No discharge.     Conjunctiva/sclera: Conjunctivae normal.  Neck:     Thyroid: No thyromegaly.  Cardiovascular:     Rate and Rhythm: Normal rate and regular rhythm.  Pulmonary:     Effort: No respiratory distress.     Breath sounds: Normal breath sounds. No wheezing.  Abdominal:     General: Bowel sounds are normal.     Palpations: Abdomen is soft.     Tenderness: There is no abdominal tenderness.  Musculoskeletal:         General: No swelling or tenderness.     Cervical back: Neck supple. No tenderness.  Lymphadenopathy:     Cervical: No cervical adenopathy.  Skin:    Findings: No erythema or rash.  Neurological:     Mental Status: She is alert.  Psychiatric:        Mood and Affect: Mood normal.        Behavior: Behavior normal.      Outpatient Encounter Medications as of 09/28/2023  Medication Sig   levothyroxine (SYNTHROID, LEVOTHROID) 75 MCG tablet TAKE 1 TABLET BY MOUTH ONCE DAILY BEFORE BREAKFAST   No facility-administered encounter medications on file as of 09/28/2023.     Lab Results  Component Value Date   WBC 5.3 09/28/2023   HGB 14.8 09/28/2023   HCT 45.0 09/28/2023   PLT 279.0 09/28/2023   GLUCOSE 92 09/28/2023   CHOL 208 (H) 09/28/2023   TRIG 94.0 09/28/2023   HDL 74.90 09/28/2023   LDLDIRECT 109.2 09/04/2013   LDLCALC 114 (H) 09/28/2023   ALT 17 09/28/2023   AST 18 09/28/2023   NA 137 09/28/2023   K 4.1 09/28/2023   CL 102 09/28/2023   CREATININE 0.94 09/28/2023   BUN 18 09/28/2023   CO2 26 09/28/2023   TSH 0.79 09/28/2023   HGBA1C 6.0 09/28/2023    MM Outside Films Mammo  Result Date: 10/03/2018 This examination belongs to an outside facility and is stored here for comparison purposes only.  Contact the originating outside institution for any associated report or interpretation.      Assessment & Plan:  Visit for screening mammogram -     3D Screening Mammogram, Left and Right; Future  Hypothyroidism, unspecified type Assessment & Plan: On thyroid replacement.  Follow tsh.    Orders: -     TSH  Screening cholesterol level -     Lipid panel  Hyperglycemia Assessment & Plan: Documented.  Low carb diet and exercise.  Follow met b and A1c.   Orders: -     CBC with Differential/Platelet -     Basic metabolic panel -     Hepatic function panel -     Hemoglobin A1c  Dizziness Assessment & Plan: Some dizziness - persistent intermittent issue.   Describes room moving/spinning with certain position changes or looking a certain way.  Persistent issue her.  Left ear - minimal aching.  Discussed referral to ENT for further evaluation.   Orders: -  Ambulatory referral to ENT  Colon cancer screening Assessment & Plan: Previously saw Dr Leone Payor.  Last colonoscopy 11/2010.  Due f/u soon.  She will schedule.       Dale [place], MD

## 2023-09-29 LAB — CBC WITH DIFFERENTIAL/PLATELET
Basophils Absolute: 0 10*3/uL (ref 0.0–0.1)
Basophils Relative: 0.9 % (ref 0.0–3.0)
Eosinophils Absolute: 0 10*3/uL (ref 0.0–0.7)
Eosinophils Relative: 0.6 % (ref 0.0–5.0)
HCT: 45 % (ref 36.0–46.0)
Hemoglobin: 14.8 g/dL (ref 12.0–15.0)
Lymphocytes Relative: 38.3 % (ref 12.0–46.0)
Lymphs Abs: 2 10*3/uL (ref 0.7–4.0)
MCHC: 32.9 g/dL (ref 30.0–36.0)
MCV: 91.9 fL (ref 78.0–100.0)
Monocytes Absolute: 0.4 10*3/uL (ref 0.1–1.0)
Monocytes Relative: 8.1 % (ref 3.0–12.0)
Neutro Abs: 2.8 10*3/uL (ref 1.4–7.7)
Neutrophils Relative %: 52.1 % (ref 43.0–77.0)
Platelets: 279 10*3/uL (ref 150.0–400.0)
RBC: 4.9 Mil/uL (ref 3.87–5.11)
RDW: 13.3 % (ref 11.5–15.5)
WBC: 5.3 10*3/uL (ref 4.0–10.5)

## 2023-09-29 LAB — TSH: TSH: 0.79 u[IU]/mL (ref 0.35–5.50)

## 2023-09-29 LAB — BASIC METABOLIC PANEL
BUN: 18 mg/dL (ref 6–23)
CO2: 26 meq/L (ref 19–32)
Calcium: 9.3 mg/dL (ref 8.4–10.5)
Chloride: 102 meq/L (ref 96–112)
Creatinine, Ser: 0.94 mg/dL (ref 0.40–1.20)
GFR: 62.8 mL/min (ref >60.00)
Glucose, Bld: 92 mg/dL (ref 70–99)
Potassium: 4.1 meq/L (ref 3.5–5.1)
Sodium: 137 meq/L (ref 135–145)

## 2023-09-29 LAB — HEPATIC FUNCTION PANEL
ALT: 17 U/L (ref 0–35)
AST: 18 U/L (ref 0–37)
Albumin: 4.4 g/dL (ref 3.5–5.2)
Alkaline Phosphatase: 84 U/L (ref 39–117)
Bilirubin, Direct: 0.1 mg/dL (ref 0.0–0.3)
Total Bilirubin: 0.6 mg/dL (ref 0.2–1.2)
Total Protein: 7.4 g/dL (ref 6.0–8.3)

## 2023-09-29 LAB — LIPID PANEL
Cholesterol: 208 mg/dL — ABNORMAL HIGH (ref 0–200)
HDL: 74.9 mg/dL (ref >39.00)
LDL Cholesterol: 114 mg/dL — ABNORMAL HIGH (ref 0–99)
NonHDL: 133.16
Total CHOL/HDL Ratio: 3
Triglycerides: 94 mg/dL (ref 0.0–149.0)
VLDL: 18.8 mg/dL (ref 0.0–40.0)

## 2023-09-29 LAB — HEMOGLOBIN A1C: Hgb A1c MFr Bld: 6 % (ref 4.6–6.5)

## 2023-10-03 ENCOUNTER — Encounter: Payer: Self-pay | Admitting: Internal Medicine

## 2023-10-03 NOTE — Assessment & Plan Note (Signed)
Documented.  Low carb diet and exercise.  Follow met b and A1c.

## 2023-10-03 NOTE — Assessment & Plan Note (Signed)
Previously saw Dr Leone Payor.  Last colonoscopy 11/2010.  Due f/u soon.  She will schedule.

## 2023-10-03 NOTE — Assessment & Plan Note (Signed)
Some dizziness - persistent intermittent issue.  Describes room moving/spinning with certain position changes or looking a certain way.  Persistent issue her.  Left ear - minimal aching.  Discussed referral to ENT for further evaluation.

## 2023-10-03 NOTE — Assessment & Plan Note (Signed)
On thyroid replacement.  Follow tsh.  

## 2023-10-04 ENCOUNTER — Telehealth: Payer: Self-pay

## 2023-10-04 NOTE — Telephone Encounter (Signed)
Noted,

## 2023-10-04 NOTE — Telephone Encounter (Signed)
I left a voicemail for patient asking her to please call us.  When patient calls back, we need to schedule an appointment for her to have her physical with Dr. Dale Belgrade in six months.

## 2023-12-14 ENCOUNTER — Encounter: Payer: Self-pay | Admitting: Internal Medicine

## 2023-12-14 ENCOUNTER — Telehealth: Payer: Self-pay | Admitting: Internal Medicine

## 2023-12-14 NOTE — Telephone Encounter (Signed)
 Copied from CRM [id number]. Topic: Clinical - Medication Refill >> Dec 14, 2023 10:58 AM Robinson DEL wrote: Most Recent Primary Care Visit:  Provider: SCOTT, CHARLENE  Department: [facility]  Visit Type: OFFICE VISIT  Date: 09/28/2023  Medication: levothyroxine  (SYNTHROID , LEVOTHROID) 75 MCG tablet  Has the patient contacted their pharmacy? Yes, needs prescription from provider (Agent: If no, request that the patient contact the pharmacy for the refill. If patient does not wish to contact the pharmacy document the reason why and proceed with request.) (Agent: If yes, when and what did the pharmacy advise?)  Is this the correct pharmacy for this prescription? Yes If no, delete pharmacy and type the correct one.  This is the patient's preferred pharmacy:  [facility] [address], [place] - [address] [place] [address] Phone: [phone] Fax: [phone]   Has the prescription been filled recently? No  Is the patient out of the medication? No  Has the patient been seen for an appointment in the last year OR does the patient have an upcoming appointment? Yes  Can we respond through MyChart? Yes  Agent: Please be advised that Rx refills may take up to 3 business days. We ask that you follow-up with your pharmacy.

## 2023-12-14 NOTE — Telephone Encounter (Signed)
 Copied from CRM [id number]. Topic: Clinical - Medication Refill >> Dec 14, 2023 10:58 AM Robinson DEL wrote: Most Recent Primary Care Visit:  Provider: SCOTT, CHARLENE  Department: [facility]  Visit Type: OFFICE VISIT  Date: 09/28/2023  Medication: levothyroxine  (SYNTHROID , LEVOTHROID) 75 MCG tablet  Has the patient contacted their pharmacy? Yes, needs prescription from provider (Agent: If no, request that the patient contact the pharmacy for the refill. If patient does not wish to contact the pharmacy document the reason why and proceed with request.) (Agent: If yes, when and what did the pharmacy advise?)  Is this the correct pharmacy for this prescription? Yes If no, delete pharmacy and type the correct one.  This is the patient's preferred pharmacy:  [facility] [address] [place] - [address] [place] [address] Phone: [phone] Fax: [phone]   Has the prescription been filled recently? No  Is the patient out of the medication? No  Has the patient been seen for an appointment in the last year OR does the patient have an upcoming appointment?   Can we respond through MyChart?   Agent: Please be advised that Rx refills may take up to 3 business days. We ask that you follow-up with your pharmacy.

## 2023-12-15 NOTE — Telephone Encounter (Signed)
 If she has been taking synthroid  75mcg q day regularly, then ok to refill,but she reestablished care with me and has no f/u appt scheduled.  Needs a f/u appt scheduled.  Ok to refill until appt

## 2023-12-15 NOTE — Telephone Encounter (Signed)
 Ok to fill her thyroid  medication since we just started seeing her again?

## 2023-12-16 MED ORDER — LEVOTHYROXINE SODIUM 75 MCG PO TABS: 75.0000 ug | ORAL_TABLET | Freq: Every day | ORAL | 1 refills | Status: DC

## 2023-12-16 NOTE — Telephone Encounter (Signed)
Pt due for follow up end of April. Pt wanted to wait until may. Scheduled for 5/16 and refilled synthroid for 6 months.

## 2023-12-17 ENCOUNTER — Ambulatory Visit
Admission: RE | Admit: 2023-12-17 | Discharge: 2023-12-17 | Disposition: A | Discharge status: Home / Self Care | Payer: Medicare Other | Source: Ambulatory Visit | Attending: Internal Medicine | Admitting: Internal Medicine

## 2023-12-17 DIAGNOSIS — Z1231 Encounter for screening mammogram for malignant neoplasm of breast: Secondary | ICD-10-CM | POA: Insufficient documentation

## 2023-12-21 ENCOUNTER — Ambulatory Visit (INDEPENDENT_AMBULATORY_CARE_PROVIDER_SITE_OTHER): Payer: Medicare Other | Admitting: *Deleted

## 2023-12-21 VITALS — Ht 64.0 in | Wt 133.0 lb

## 2023-12-21 DIAGNOSIS — Z0000 Encounter for general adult medical examination without abnormal findings: Secondary | ICD-10-CM

## 2023-12-21 NOTE — Progress Notes (Signed)
Subjective:   Amanda [name] is a 68 y.o. female who presents for an Initial Medicare Annual Wellness Visit.  Visit Complete: Virtual I connected with  Amanda [name] on 12/21/23 by a audio enabled telemedicine application and verified that I am speaking with the correct person using two identifiers. This patient declined Interactive audio and Acupuncturist. Therefore the visit was completed with audio only.   Patient Location: Home  Provider Location: Office/Clinic  I discussed the limitations of evaluation and management by telemedicine. The patient expressed understanding and agreed to proceed.  Vital Signs: Because this visit was a virtual/telehealth visit, some criteria may be missing or patient reported. Any vitals not documented were not able to be obtained and vitals that have been documented are patient reported.    Cardiac Risk Factors include: advanced age (>16men, >57 women)     Objective:    Today's Vitals   12/21/23 1507  Weight: 133 lb (60.3 kg)  Height: 5\' 4"  (1.626 m)   Body mass index is 22.83 kg/m.     12/21/2023    3:20 PM  Advanced Directives  Does Patient Have a Medical Advance Directive? No  Would patient like information on creating a medical advance directive? No - Patient declined    Current Medications (verified) Outpatient Encounter Medications as of 12/21/2023  Medication Sig   levothyroxine (SYNTHROID) 75 MCG tablet Take 1 tablet (75 mcg total) by mouth daily before breakfast.   No facility-administered encounter medications on file as of 12/21/2023.    Allergies (verified) Patient has no known allergies.   History: Past Medical History:  Diagnosis Date   Asthma    Diverticulitis    H/O   History of chicken pox    Hypothyroidism    Past Surgical History:  Procedure Laterality Date   COLON SURGERY  06/15/05   DERMOID CYST  EXCISION     Family History  Problem Relation Age of Onset   Arthritis Mother     Hyperlipidemia Mother    Hypertension Mother    Arthritis Father    Hypertension Father    Breast cancer Maternal Aunt    Arthritis Paternal Grandmother    Social History   Socioeconomic History   Marital status: Married    Spouse name: Not on file   Number of children: 3   Years of education: Not on file   Highest education level: Not on file  Occupational History    Employer: [employer]  Tobacco Use   Smoking status: Never   Smokeless tobacco: Never  Substance and Sexual Activity   Alcohol use: Yes    Comment: seldom   Drug use: No   Sexual activity: Not on file  Other Topics Concern   Not on file  Social History Narrative   Married   Social Drivers of Health   Financial Resource Strain: Low Risk  (12/21/2023)   Overall Financial Resource Strain (CARDIA)    Difficulty of Paying Living Expenses: Not hard at all  Food Insecurity: No Food Insecurity (12/21/2023)   Hunger Vital Sign    Worried About Running Out of Food in the Last Year: Never true    Ran Out of Food in the Last Year: Never true  Transportation Needs: No Transportation Needs (12/21/2023)   PRAPARE - Administrator, Civil Service (Medical): No    Lack of Transportation (Non-Medical): No  Physical Activity: Insufficiently Active (12/21/2023)   Exercise Vital Sign    Days  of Exercise per Week: 2 days    Minutes of Exercise per Session: 30 min  Stress: No Stress Concern Present (12/21/2023)   [employer] of Occupational Health - Occupational Stress Questionnaire    Feeling of Stress : Not at all  Social Connections: Moderately Integrated (12/21/2023)   Social Connection and Isolation Panel [NHANES]    Frequency of Communication with Friends and Family: More than three times a week    Frequency of Social Gatherings with Friends and Family: More than three times a week    Attends Religious Services: 1 to 4 times per year    Active Member of [employer] or Organizations: No    Attends  Engineer, structural: Never    Marital Status: Married    Tobacco Counseling Counseling given: Not Answered   Clinical Intake:  Pre-visit preparation completed: Yes  Pain : No/denies pain     BMI - recorded: 22.83 Nutritional Status: BMI of 19-24  Normal Nutritional Risks: None Diabetes: No  How often do you need to have someone help you when you read instructions, pamphlets, or other written materials from your doctor or pharmacy?: 1 - Never  Interpreter Needed?: No  Information entered by :: R. Chanay Nugent LPN   Activities of Daily Living    12/21/2023    3:09 PM  In your present state of health, do you have any difficulty performing the following activities:  Hearing? 0  Vision? 0  Difficulty concentrating or making decisions? 0  Walking or climbing stairs? 0  Dressing or bathing? 0  Doing errands, shopping? 0  Preparing Food and eating ? N  Using the Toilet? N  In the past six months, have you accidently leaked urine? N  Do you have problems with loss of bowel control? N  Managing your Medications? N  Managing your Finances? N  Housekeeping or managing your Housekeeping? N    Patient Care Team: Dale [place], MD as PCP - General (Internal Medicine)  Indicate any recent Medical Services you may have received from other than [facility] providers in the past year (date may be approximate).     Assessment:   This is a routine wellness examination for [place].  Hearing/Vision screen Hearing Screening - Comments:: No issues Vision Screening - Comments:: No glasses   Goals Addressed             This Visit's Progress    Patient Stated       Continue to stay active       Depression Screen    12/21/2023    3:12 PM 09/28/2023    3:04 PM 09/26/2019    3:06 PM 01/01/2014    2:31 PM  PHQ 2/9 Scores  PHQ - 2 Score 0 0 0 0  PHQ- 9 Score 0       Fall Risk    12/21/2023    3:10 PM 09/28/2023    3:04 PM 09/26/2019    3:06 PM 01/01/2014    2:31 PM   Fall Risk   Falls in the past year? 0 0 0 No  Number falls in past yr: 0 0    Injury with Fall? 0 0    Risk for fall due to : No Fall Risks No Fall Risks    Follow up Falls prevention discussed;Falls evaluation completed Falls evaluation completed Falls evaluation completed     MEDICARE RISK AT HOME: Medicare Risk at Home Any stairs in or around the home?: Yes If so, are there  any without handrails?: No Home free of loose throw rugs in walkways, pet beds, electrical cords, etc?: Yes Adequate lighting in your home to reduce risk of falls?: Yes Life alert?: No Use of a cane, walker or w/c?: No Grab bars in the bathroom?: No Shower chair or bench in shower?: No Elevated toilet seat or a handicapped toilet?: No   Cognitive Function:        12/21/2023    3:20 PM  6CIT Screen  What Year? 0 points  What month? 0 points  What time? 0 points  Count back from 20 0 points  Months in reverse 0 points  Repeat phrase 0 points  Total Score 0 points    Immunizations Immunization History  Administered Date(s) Administered   Influenza,inj,Quad PF,6+ Mos 09/04/2013, 09/13/2018, 07/25/2019   Influenza-Unspecified 07/31/2017   Zoster Recombinant(Shingrix) 01/17/2018, 09/20/2018    TDAP status: Due, Education has been provided regarding the importance of this vaccine. Advised may receive this vaccine at local pharmacy or Health Dept. Aware to provide a copy of the vaccination record if obtained from local pharmacy or Health Dept. Verbalized acceptance and understanding.  Flu Vaccine status: Declined, Education has been provided regarding the importance of this vaccine but patient still declined. Advised may receive this vaccine at local pharmacy or Health Dept. Aware to provide a copy of the vaccination record if obtained from local pharmacy or Health Dept. Verbalized acceptance and understanding.  Pneumococcal vaccine status: Due, Education has been provided regarding the importance of  this vaccine. Advised may receive this vaccine at local pharmacy or Health Dept. Aware to provide a copy of the vaccination record if obtained from local pharmacy or Health Dept. Verbalized acceptance and understanding.  Covid-19 vaccine status: Declined, Education has been provided regarding the importance of this vaccine but patient still declined. Advised may receive this vaccine at local pharmacy or Health Dept.or vaccine clinic. Aware to provide a copy of the vaccination record if obtained from local pharmacy or Health Dept. Verbalized acceptance and understanding.  Qualifies for Shingles Vaccine? Yes   Zostavax completed No   Shingrix Completed?: Yes  Screening Tests Health Maintenance  Topic Date Due   Medicare Annual Wellness (AWV)  Never done   Pneumonia Vaccine 29+ Years old (1 of 2 - PCV) Never done   Hepatitis C Screening  Never done   DTaP/Tdap/Td (1 - Tdap) Never done   Colonoscopy  12/19/2020   DEXA SCAN  Never done   COVID-19 Vaccine (1 - 2024-25 season) Never done   INFLUENZA VACCINE  02/28/2024 (Originally 07/01/2023)   MAMMOGRAM  06/15/2024   Zoster Vaccines- Shingrix  Completed   HPV VACCINES  Aged Out    Health Maintenance  Health Maintenance Due  Topic Date Due   Medicare Annual Wellness (AWV)  Never done   Pneumonia Vaccine 22+ Years old (1 of 2 - PCV) Never done   Hepatitis C Screening  Never done   DTaP/Tdap/Td (1 - Tdap) Never done   Colonoscopy  12/19/2020   DEXA SCAN  Never done   COVID-19 Vaccine (1 - 2024-25 season) Never done    Colorectal cancer screening: Type of screening: Colonoscopy. Completed 11/2010. Repeat every 10 years Appointment scheduled 02/04/24  Mammogram status: Completed 12/2023. Repeat every year  Bone Density status. Patient stated that she has at [facility] and will continue to have there. Report requested    Lung Cancer Screening: (Low Dose CT Chest recommended if Age 67-80 years, 20 pack-year currently  smoking OR  have quit w/in 15years.) does not qualify.     Additional Screening:  Hepatitis C Screening: does qualify; Completed Wants to discuss with PCP  Vision Screening: Recommended annual ophthalmology exams for early detection of glaucoma and other disorders of the eye. Is the patient up to date with their annual eye exam?  Yes  Who is the provider or what is the name of the office in which the patient attends annual eye exams? Dr. Senaida Ores  If pt is not established with a provider, would they like to be referred to a provider to establish care? No .   Dental Screening: Recommended annual dental exams for proper oral hygiene   Community Resource Referral / Chronic Care Management: CRR required this visit?  No   CCM required this visit?  No     Plan:     I have personally reviewed and noted the following in the patient's chart:   Medical and social history Use of alcohol, tobacco or illicit drugs  Current medications and supplements including opioid prescriptions. Patient is not currently taking opioid prescriptions. Functional ability and status Nutritional status Physical activity Advanced directives List of other physicians Hospitalizations, surgeries, and ER visits in previous 12 months Vitals Screenings to include cognitive, depression, and falls Referrals and appointments  In addition, I have reviewed and discussed with patient certain preventive protocols, quality metrics, and best practice recommendations. A written personalized care plan for preventive services as well as general preventive health recommendations were provided to patient.     Sydell Axon, LPN   1/61/0960   After Visit Summary: (MyChart) Due to this being a telephonic visit, the after visit summary with patients personalized plan was offered to patient via MyChart   Nurse Notes: None

## 2023-12-21 NOTE — Patient Instructions (Addendum)
Ms. Depena , Thank you for taking time to come for your Medicare Wellness Visit. I appreciate your ongoing commitment to your health goals. Please review the following plan we discussed and let me know if I can assist you in the future.   Referrals/Orders/Follow-Ups/Clinician Recommendations: Remember to update your Pneumonia vaccine.  This is a list of the screening recommended for you and due dates:  Health Maintenance  Topic Date Due   Pneumonia Vaccine (1 of 2 - PCV) Never done   Hepatitis C Screening  Never done   DTaP/Tdap/Td vaccine (1 - Tdap) Never done   Colon Cancer Screening  12/19/2020   DEXA scan (bone density measurement)  Never done   COVID-19 Vaccine (1 - 2024-25 season) Never done   Flu Shot  02/28/2024*   Mammogram  06/15/2024   Medicare Annual Wellness Visit  12/20/2024   Zoster (Shingles) Vaccine  Completed   HPV Vaccine  Aged Out  *Topic was postponed. The date shown is not the original due date.    Advanced directives: (Declined) Advance directive discussed with you today. Even though you declined this today, please call our office should you change your mind, and we can give you the proper paperwork for you to fill out.  Next Medicare Annual Wellness Visit scheduled for next year: Yes 12/29/24 @ 11:30

## 2024-01-21 ENCOUNTER — Ambulatory Visit: Payer: Federal, State, Local not specified - PPO

## 2024-01-21 VITALS — Ht 64.0 in | Wt 133.0 lb

## 2024-01-21 DIAGNOSIS — Z8719 Personal history of other diseases of the digestive system: Secondary | ICD-10-CM

## 2024-01-21 DIAGNOSIS — Z1211 Encounter for screening for malignant neoplasm of colon: Secondary | ICD-10-CM

## 2024-01-21 MED ORDER — SUTAB 1479-225-188 MG PO TABS: ORAL_TABLET | ORAL | 0 refills | Status: DC

## 2024-01-21 NOTE — Progress Notes (Signed)
 No egg or soy allergy known to patient  No issues known to pt with past sedation with any surgeries or procedures Patient denies ever being told they had issues or difficulty with intubation  No FH of Malignant Hyperthermia Pt is not on diet pills Pt is not on  home 02  Pt is not on blood thinners  Pt denies issues with constipation  No A fib or A flutter Have any cardiac testing pending--no No GLP medications Ambulates independently Patient's chart reviewed by Cathlyn Parsons CNRA prior to previsit and patient appropriate for the [facility].  Previsit completed and red dot placed by patient's name on their procedure day (on provider's schedule).

## 2024-02-03 NOTE — Progress Notes (Signed)
 [facility] History and Physical   Primary Care Physician:  Dale Inavale, MD   Reason for Procedure:   CRCA screening   Plan:    colonoscopy     HPI: Amanda [name] is a 68 y.o. female s/p negative colonoscopy 2012 - returns for repeat screening. There was a small mucosal polyp removed in 2012 but no neoplasia.She is s/p laparoscopic assisted sigmoid colectomy 2006   Past Medical History:  Diagnosis Date   Asthma    Diverticulitis    H/O   History of chicken pox    Hypothyroidism     Past Surgical History:  Procedure Laterality Date   COLON SURGERY  06/15/05   DERMOID CYST  EXCISION      Prior to Admission medications   Medication Sig Start Date End Date Taking? Authorizing Provider  levothyroxine (SYNTHROID) 75 MCG tablet Take 1 tablet (75 mcg total) by mouth daily before breakfast. 12/16/23   Dale [place], MD  Sodium Sulfate-Mag Sulfate-KCl (SUTAB) [phone] MG TABS Use as directed for colonoscopy. MANUFACTURER CODES!! BIN: [id number] PCN: CN GROUP: [id number] MEMBER ID: [id number];[employer] AS SECONDARY INSURANCE ;NO PRIOR AUTHORIZATION 01/21/24   Iva Boop, MD    Current Outpatient Medications  Medication Sig Dispense Refill   levothyroxine (SYNTHROID) 75 MCG tablet Take 1 tablet (75 mcg total) by mouth daily before breakfast. 90 tablet 1   Current Facility-Administered Medications  Medication Dose Route Frequency Provider Last Rate Last Admin   0.9 %  sodium chloride infusion  500 mL Intravenous Once Iva Boop, MD        Allergies as of 02/04/2024   (No Known Allergies)    Family History  Problem Relation Age of Onset   Arthritis Mother    Hyperlipidemia Mother    Hypertension Mother    Arthritis Father    Hypertension Father    Breast cancer Maternal Aunt    Arthritis Paternal Grandmother    Colon cancer Neg Hx    Colon polyps Neg Hx    Rectal cancer Neg Hx    Stomach cancer Neg Hx     Social History   Socioeconomic  History   Marital status: Married    Spouse name: Not on file   Number of children: 3   Years of education: Not on file   Highest education level: Not on file  Occupational History    Employer: [employer]  Tobacco Use   Smoking status: Never   Smokeless tobacco: Never  Vaping Use   Vaping status: Never Used  Substance and Sexual Activity   Alcohol use: Yes    Comment: seldom   Drug use: No   Sexual activity: Not on file  Other Topics Concern   Not on file  Social History Narrative   Married   Social Drivers of Health   Financial Resource Strain: Low Risk  (12/21/2023)   Overall Financial Resource Strain (CARDIA)    Difficulty of Paying Living Expenses: Not hard at all  Food Insecurity: No Food Insecurity (12/21/2023)   Hunger Vital Sign    Worried About Running Out of Food in the Last Year: Never true    Ran Out of Food in the Last Year: Never true  Transportation Needs: No Transportation Needs (12/21/2023)   PRAPARE - Administrator, Civil Service (Medical): No    Lack of Transportation (Non-Medical): No  Physical Activity: Insufficiently Active (12/21/2023)   Exercise Vital Sign    Days of Exercise per  Week: 2 days    Minutes of Exercise per Session: 30 min  Stress: No Stress Concern Present (12/21/2023)   [employer] of Occupational Health - Occupational Stress Questionnaire    Feeling of Stress : Not at all  Social Connections: Moderately Integrated (12/21/2023)   Social Connection and Isolation Panel [NHANES]    Frequency of Communication with Friends and Family: More than three times a week    Frequency of Social Gatherings with Friends and Family: More than three times a week    Attends Religious Services: 1 to 4 times per year    Active Member of [employer] or Organizations: No    Attends Banker Meetings: Never    Marital Status: Married  Catering manager Violence: Not At Risk (12/21/2023)   Humiliation, Afraid, Rape, and Kick  questionnaire    Fear of Current or Ex-Partner: No    Emotionally Abused: No    Physically Abused: No    Sexually Abused: No    Review of Systems:  All other review of systems negative except as mentioned in the HPI.  Physical Exam: Vital signs BP 132/81   Pulse (!) 115   Temp (!) 97.3 F (36.3 C) (Skin)   Ht 5\' 4"  (1.626 m)   Wt 133 lb (60.3 kg)   LMP 05/01/2011   SpO2 97%   BMI 22.83 kg/m   General:   Alert,  Well-developed, well-nourished, pleasant and cooperative in NAD Lungs:  Clear throughout to auscultation.   Heart:  Regular rate and rhythm; no murmurs, clicks, rubs,  or gallops. Abdomen:  Soft, nontender and nondistended. Normal bowel sounds.   Neuro/Psych:  Alert and cooperative. Normal mood and affect. A and O x 3   @Lynnsey Barbara  Sena Slate, MD, [facility] [phone] (pager) 02/04/2024 9:46 AM@

## 2024-02-04 ENCOUNTER — Encounter: Payer: Self-pay | Admitting: Internal Medicine

## 2024-02-04 ENCOUNTER — Ambulatory Visit: Payer: Medicare Other | Admitting: Internal Medicine

## 2024-02-04 VITALS — BP 121/73 | HR 68 | Temp 97.3°F | Resp 18 | Ht 64.0 in | Wt 133.0 lb

## 2024-02-04 DIAGNOSIS — Z8719 Personal history of other diseases of the digestive system: Secondary | ICD-10-CM

## 2024-02-04 DIAGNOSIS — K635 Polyp of colon: Secondary | ICD-10-CM | POA: Diagnosis not present

## 2024-02-04 DIAGNOSIS — Z1211 Encounter for screening for malignant neoplasm of colon: Secondary | ICD-10-CM | POA: Diagnosis not present

## 2024-02-04 DIAGNOSIS — Z980 Intestinal bypass and anastomosis status: Secondary | ICD-10-CM

## 2024-02-04 DIAGNOSIS — D125 Benign neoplasm of sigmoid colon: Secondary | ICD-10-CM

## 2024-02-04 DIAGNOSIS — D120 Benign neoplasm of cecum: Secondary | ICD-10-CM

## 2024-02-04 MED ORDER — SODIUM CHLORIDE 0.9 % IV SOLN: 500.0000 mL | Freq: Once | INTRAVENOUS | Status: DC

## 2024-02-04 NOTE — Progress Notes (Signed)
 Called to room to assist during endoscopic procedure.  Patient ID and intended procedure confirmed with present staff. Received instructions for my participation in the procedure from the performing physician.

## 2024-02-04 NOTE — Op Note (Signed)
 [facility] Patient Name: Amanda [name] Procedure Date: 02/04/2024 9:49 AM MRN: [id number] Endoscopist: Iva Boop , MD, [id number] Age: 68 Referring MD:  Date of Birth: 1956-05-05 Gender: Female Account #: [id number] Procedure:                Colonoscopy Indications:              Screening for colorectal malignant neoplasm, Last                            colonoscopy: 2012 Medicines:                Monitored Anesthesia Care Procedure:                Pre-Anesthesia Assessment:                           - Prior to the procedure, a History and Physical                            was performed, and patient medications and                            allergies were reviewed. The patient's tolerance of                            previous anesthesia was also reviewed. The risks                            and benefits of the procedure and the sedation                            options and risks were discussed with the patient.                            All questions were answered, and informed consent                            was obtained. Prior Anticoagulants: The patient has                            taken no anticoagulant or antiplatelet agents. ASA                            Grade Assessment: II - A patient with mild systemic                            disease. After reviewing the risks and benefits,                            the patient was deemed in satisfactory condition to                            undergo the procedure.  After obtaining informed consent, the colonoscope                            was passed under direct vision. Throughout the                            procedure, the patient's blood pressure, pulse, and                            oxygen saturations were monitored continuously. The                            Olympus Scope SN [id number] was introduced through the                            anus and advanced to the the cecum,  identified by                            appendiceal orifice and ileocecal valve. The                            colonoscopy was performed without difficulty. The                            patient tolerated the procedure well. The quality                            of the bowel preparation was good. The ileocecal                            valve, appendiceal orifice, and rectum were                            photographed. The bowel preparation used was SUTAB                            via split dose instruction. Scope In: 9:55:51 AM Scope Out: 10:11:06 AM Scope Withdrawal Time: 0 hours 12 minutes 23 seconds  Total Procedure Duration: 0 hours 15 minutes 15 seconds  Findings:                 The perianal and digital rectal examinations were                            normal.                           Three sessile polyps were found in the distal                            sigmoid colon and cecum. The polyps were 1 to 2 mm                            in size. These polyps were removed with a cold  biopsy forceps. Resection and retrieval were                            complete. Verification of patient identification                            for the specimen was done. Estimated blood loss was                            minimal.                           There was evidence of a prior end-to-end                            colo-colonic anastomosis in the distal sigmoid                            colon. This was patent and was characterized by                            healthy appearing mucosa.                           The exam was otherwise without abnormality on                            direct and retroflexion views. Complications:            No immediate complications. Estimated Blood Loss:     Estimated blood loss was minimal. Impression:               - Three 1 to 2 mm polyps in the distal sigmoid                            colon and in the cecum, removed  with a cold biopsy                            forceps. Resected and retrieved.                           - Patent end-to-end colo-colonic anastomosis,                            characterized by healthy appearing mucosa.                           - The examination was otherwise normal on direct                            and retroflexion views. Recommendation:           - Patient has a contact number available for                            emergencies. The signs and symptoms of potential  delayed complications were discussed with the                            patient. Return to normal activities tomorrow.                            Written discharge instructions were provided to the                            patient.                           - Resume previous diet.                           - Continue present medications.                           - Repeat colonoscopy is recommended. The                            colonoscopy date will be determined after pathology                            results from today's exam become available for                            review. Iva Boop, MD 02/04/2024 10:19:03 AM This report has been signed electronically.

## 2024-02-04 NOTE — Progress Notes (Signed)
 Vss nad trans to pacu

## 2024-02-04 NOTE — Progress Notes (Signed)
 Pt's states no medical or surgical changes since previsit or office visit.

## 2024-02-04 NOTE — Patient Instructions (Addendum)
 3 very tiny polyps were removed. I will let you know pathology results and when to have another routine colonoscopy by mail and/or My Chart.  I appreciate the opportunity to care for you. Iva Boop, MD, [facility]  Resume previous diet.  Continue present medications.  Handout provided on polyps.   YOU HAD AN ENDOSCOPIC PROCEDURE TODAY AT THE [facility]:   Refer to the procedure report that was given to you for any specific questions about what was found during the examination.  If the procedure report does not answer your questions, please call your gastroenterologist to clarify.  If you requested that your care partner not be given the details of your procedure findings, then the procedure report has been included in a sealed envelope for you to review at your convenience later.  YOU SHOULD EXPECT: Some feelings of bloating in the abdomen. Passage of more gas than usual.  Walking can help get rid of the air that was put into your GI tract during the procedure and reduce the bloating. If you had a lower endoscopy (such as a colonoscopy or flexible sigmoidoscopy) you may notice spotting of blood in your stool or on the toilet paper. If you underwent a bowel prep for your procedure, you may not have a normal bowel movement for a few days.  Please Note:  You might notice some irritation and congestion in your nose or some drainage.  This is from the oxygen used during your procedure.  There is no need for concern and it should clear up in a day or so.  SYMPTOMS TO REPORT IMMEDIATELY:  Following lower endoscopy (colonoscopy or flexible sigmoidoscopy):  Excessive amounts of blood in the stool  Significant tenderness or worsening of abdominal pains  Swelling of the abdomen that is new, acute  Fever of 100F or higher  For urgent or emergent issues, a gastroenterologist can be reached at any hour by calling [phone]. Do not use MyChart messaging for urgent concerns.    DIET:   We do recommend a small meal at first, but then you may proceed to your regular diet.  Drink plenty of fluids but you should avoid alcoholic beverages for 24 hours.  ACTIVITY:  You should plan to take it easy for the rest of today and you should NOT DRIVE or use heavy machinery until tomorrow (because of the sedation medicines used during the test).    FOLLOW UP: Our staff will call the number listed on your records the next business day following your procedure.  We will call around 7:15- 8:00 am to check on you and address any questions or concerns that you may have regarding the information given to you following your procedure. If we do not reach you, we will leave a message.     If any biopsies were taken you will be contacted by phone or by letter within the next 1-3 weeks.  Please call us at [phone] if you have not heard about the biopsies in 3 weeks.    SIGNATURES/CONFIDENTIALITY: You and/or your care partner have signed paperwork which will be entered into your electronic medical record.  These signatures attest to the fact that that the information above on your After Visit Summary has been reviewed and is understood.  Full responsibility of the confidentiality of this discharge information lies with you and/or your care-partner.

## 2024-02-07 ENCOUNTER — Telehealth: Payer: Self-pay

## 2024-02-07 NOTE — Telephone Encounter (Signed)
  Follow up Call-     02/04/2024    9:06 AM  Call back number  Post procedure Call Back phone  # [phone]  Permission to leave phone message Yes     Patient questions:  Do you have a fever, pain , or abdominal swelling? No. Pain Score  0 *  Have you tolerated food without any problems? Yes.    Have you been able to return to your normal activities? Yes.    Do you have any questions about your discharge instructions: Diet   No. Medications  No. Follow up visit  No.  Do you have questions or concerns about your Care? No.  Actions: * If pain score is 4 or above: No action needed, pain <4.

## 2024-02-10 LAB — SURGICAL PATHOLOGY

## 2024-02-11 ENCOUNTER — Encounter: Payer: Self-pay | Admitting: Internal Medicine

## 2024-04-14 ENCOUNTER — Ambulatory Visit: Payer: Medicare Other | Admitting: Internal Medicine

## 2024-04-27 ENCOUNTER — Telehealth: Payer: Self-pay

## 2024-04-27 NOTE — Telephone Encounter (Signed)
 See me about this. Will document in patients chart. Daughter declined appt for patient.

## 2024-04-27 NOTE — Telephone Encounter (Signed)
 Copied from CRM [id number]. Topic: Clinical - Medical Advice >> Apr 27, 2024 11:31 AM Dewanda Foots wrote: Reason for CRM: Pt has an appt on 5/30 and states that mother has reoccuring UTI's and bladder spasms, she was given cups by the pcp because of how often this happens.  Daughter Rasmussen) would like to know if it is okay to bring a sample of mother's urine with her to the appt to get this tested for another UTI.   Mother's MRN is :  [id number] Gwinda Leopard  She would like to know as soon as possible so she can collect a sample if needed. Thank you so much  Patient callback is [phone]

## 2024-04-27 NOTE — Telephone Encounter (Signed)
 See Amanda [name]'s chart. Appt tomorrow.

## 2024-04-28 ENCOUNTER — Encounter: Payer: Self-pay | Admitting: Internal Medicine

## 2024-04-28 ENCOUNTER — Ambulatory Visit (INDEPENDENT_AMBULATORY_CARE_PROVIDER_SITE_OTHER): Payer: Medicare Other | Admitting: Internal Medicine

## 2024-04-28 VITALS — BP 110/76 | HR 110 | Ht 64.0 in | Wt 133.0 lb

## 2024-04-28 DIAGNOSIS — E7800 Pure hypercholesterolemia, unspecified: Secondary | ICD-10-CM

## 2024-04-28 DIAGNOSIS — J4520 Mild intermittent asthma, uncomplicated: Secondary | ICD-10-CM | POA: Diagnosis not present

## 2024-04-28 DIAGNOSIS — R739 Hyperglycemia, unspecified: Secondary | ICD-10-CM

## 2024-04-28 DIAGNOSIS — E039 Hypothyroidism, unspecified: Secondary | ICD-10-CM | POA: Diagnosis not present

## 2024-04-28 MED ORDER — LEVOTHYROXINE SODIUM 75 MCG PO TABS: 75.0000 ug | ORAL_TABLET | Freq: Every day | ORAL | 1 refills | Status: DC

## 2024-04-28 NOTE — Progress Notes (Signed)
 Subjective:    Patient ID: Amanda [name], female    DOB: Apr 06, 1956, 68 y.o.   MRN: [id number]  Patient here for  Chief Complaint  Patient presents with   Medical Management of Chronic Issues    HPI Here for a scheduled follow up - follow up regarding hypothyroidism and hyperglycemia. Last visit - dizziness - referred to ENT. S/p laryngoscopy - ok. Epley maneuvers helped. Plans to do modified epley maneuvers at home if needed. No significant problems now. Discussed using nasacort nasal spray. No headache reported. No chest pain or sob reported.    Past Medical History:  Diagnosis Date   Asthma    Diverticulitis    H/O   History of chicken pox    Hypothyroidism    Past Surgical History:  Procedure Laterality Date   COLON SURGERY  06/15/05   DERMOID CYST  EXCISION     Family History  Problem Relation Age of Onset   Arthritis Mother    Hyperlipidemia Mother    Hypertension Mother    Arthritis Father    Hypertension Father    Breast cancer Maternal Aunt    Arthritis Paternal Grandmother    Colon cancer Neg Hx    Colon polyps Neg Hx    Rectal cancer Neg Hx    Stomach cancer Neg Hx    Social History   Socioeconomic History   Marital status: Married    Spouse name: Not on file   Number of children: 3   Years of education: Not on file   Highest education level: Not on file  Occupational History    Employer: [employer]  Tobacco Use   Smoking status: Never   Smokeless tobacco: Never  Vaping Use   Vaping status: Never Used  Substance and Sexual Activity   Alcohol use: Yes    Comment: seldom   Drug use: No   Sexual activity: Not on file  Other Topics Concern   Not on file  Social History Narrative   Married   Social Drivers of Health   Financial Resource Strain: Low Risk  (12/21/2023)   Overall Financial Resource Strain (CARDIA)    Difficulty of Paying Living Expenses: Not hard at all  Food Insecurity: No Food Insecurity (12/21/2023)   Hunger  Vital Sign    Worried About Running Out of Food in the Last Year: Never true    Ran Out of Food in the Last Year: Never true  Transportation Needs: No Transportation Needs (12/21/2023)   PRAPARE - Administrator, Civil Service (Medical): No    Lack of Transportation (Non-Medical): No  Physical Activity: Insufficiently Active (12/21/2023)   Exercise Vital Sign    Days of Exercise per Week: 2 days    Minutes of Exercise per Session: 30 min  Stress: No Stress Concern Present (12/21/2023)   [employer] of Occupational Health - Occupational Stress Questionnaire    Feeling of Stress : Not at all  Social Connections: Moderately Integrated (12/21/2023)   Social Connection and Isolation Panel [NHANES]    Frequency of Communication with Friends and Family: More than three times a week    Frequency of Social Gatherings with Friends and Family: More than three times a week    Attends Religious Services: 1 to 4 times per year    Active Member of [employer] or Organizations: No    Attends Banker Meetings: Never    Marital Status: Married     Review of  Systems  Constitutional:  Negative for appetite change and unexpected weight change.  HENT:  Negative for congestion and sinus pressure.   Respiratory:  Negative for cough, chest tightness and shortness of breath.   Cardiovascular:  Negative for chest pain, palpitations and leg swelling.  Gastrointestinal:  Negative for abdominal pain, diarrhea, nausea and vomiting.  Genitourinary:  Negative for difficulty urinating and dysuria.  Musculoskeletal:  Negative for joint swelling and myalgias.  Skin:  Negative for color change and rash.  Neurological:  Negative for dizziness and headaches.  Psychiatric/Behavioral:  Negative for agitation and dysphoric mood.        Increased stress - mother's health issues.        Objective:     BP 110/76   Pulse (!) 110   Ht 5\' 4"  (1.626 m)   Wt 133 lb (60.3 kg)   LMP 05/01/2011   SpO2  98%   BMI 22.83 kg/m  Wt Readings from Last 3 Encounters:  04/28/24 133 lb (60.3 kg)  02/04/24 133 lb (60.3 kg)  01/21/24 133 lb (60.3 kg)    Physical Exam Vitals reviewed.  Constitutional:      General: She is not in acute distress.    Appearance: Normal appearance.  HENT:     Head: Normocephalic and atraumatic.     Right Ear: Tympanic membrane, ear canal and external ear normal.     Left Ear: Tympanic membrane, ear canal and external ear normal.     Mouth/Throat:     Pharynx: No oropharyngeal exudate or posterior oropharyngeal erythema.  Eyes:     General: No scleral icterus.       Right eye: No discharge.        Left eye: No discharge.     Conjunctiva/sclera: Conjunctivae normal.  Neck:     Thyroid : No thyromegaly.  Cardiovascular:     Rate and Rhythm: Normal rate and regular rhythm.  Pulmonary:     Effort: No respiratory distress.     Breath sounds: Normal breath sounds. No wheezing.  Abdominal:     General: Bowel sounds are normal.     Palpations: Abdomen is soft.     Tenderness: There is no abdominal tenderness.  Musculoskeletal:        General: No swelling or tenderness.     Cervical back: Neck supple. No tenderness.  Lymphadenopathy:     Cervical: No cervical adenopathy.  Skin:    Findings: No erythema or rash.  Neurological:     Mental Status: She is alert.  Psychiatric:        Mood and Affect: Mood normal.        Behavior: Behavior normal.         Outpatient Encounter Medications as of 04/28/2024  Medication Sig   [DISCONTINUED] levothyroxine  (SYNTHROID ) 75 MCG tablet Take 1 tablet (75 mcg total) by mouth daily before breakfast.   levothyroxine  (SYNTHROID ) 75 MCG tablet Take 1 tablet (75 mcg total) by mouth daily before breakfast.   No facility-administered encounter medications on file as of 04/28/2024.     Lab Results  Component Value Date   WBC 5.3 09/28/2023   HGB 14.8 09/28/2023   HCT 45.0 09/28/2023   PLT 279.0 09/28/2023   GLUCOSE 92  09/28/2023   CHOL 208 (H) 09/28/2023   TRIG 94.0 09/28/2023   HDL 74.90 09/28/2023   LDLDIRECT 109.2 09/04/2013   LDLCALC 114 (H) 09/28/2023   ALT 17 09/28/2023   AST 18 09/28/2023   NA 137  09/28/2023   K 4.1 09/28/2023   CL 102 09/28/2023   CREATININE 0.94 09/28/2023   BUN 18 09/28/2023   CO2 26 09/28/2023   TSH 0.79 09/28/2023   HGBA1C 6.0 09/28/2023    MM 3D SCREENING MAMMOGRAM BILATERAL BREAST Result Date: 12/20/2023 CLINICAL DATA:  Screening. EXAM: DIGITAL SCREENING BILATERAL MAMMOGRAM WITH TOMOSYNTHESIS AND CAD TECHNIQUE: Bilateral screening digital craniocaudal and mediolateral oblique mammograms were obtained. Bilateral screening digital breast tomosynthesis was performed. The images were evaluated with computer-aided detection. COMPARISON:  Previous exam(s). ACR Breast Density Category c: The breasts are heterogeneously dense, which may obscure small masses. FINDINGS: There are no findings suspicious for malignancy. IMPRESSION: No mammographic evidence of malignancy. A result letter of this screening mammogram will be mailed directly to the patient. RECOMMENDATION: Screening mammogram in one year. (Code:SM-B-01Y) BI-RADS CATEGORY  1: Negative. Electronically Signed   By: Roda Cirri M.D.   On: 12/20/2023 13:05       Assessment & Plan:  Hyperglycemia Assessment & Plan: Low carb diet and exercise. Follow met b and A1c.   Orders: -     Hemoglobin A1c; Future  Hypothyroidism, unspecified type Assessment & Plan: On thyroid  replacement. Follow tsh.    Hypercholesteremia Assessment & Plan: Follow lipid panel. She has been working on diet and exercise. Wants to schedule fasting labs in the future.   Orders: -     Basic metabolic panel with GFR; Future -     Hepatic function panel; Future -     Lipid panel; Future  Mild intermittent asthma without complication Assessment & Plan: Breathing stable.    Other orders -     Levothyroxine  Sodium; Take 1 tablet (75 mcg  total) by mouth daily before breakfast.  Dispense: 90 tablet; Refill: 1     Dellar Fenton, MD

## 2024-04-30 ENCOUNTER — Encounter: Payer: Self-pay | Admitting: Internal Medicine

## 2024-04-30 NOTE — Assessment & Plan Note (Signed)
 Breathing stable.

## 2024-04-30 NOTE — Assessment & Plan Note (Signed)
 On thyroid replacement.  Follow tsh.

## 2024-04-30 NOTE — Assessment & Plan Note (Signed)
 Follow lipid panel. She has been working on diet and exercise. Wants to schedule fasting labs in the future.

## 2024-04-30 NOTE — Assessment & Plan Note (Signed)
 Low-carb diet and exercise.  Follow met b and A1c.

## 2024-11-03 ENCOUNTER — Ambulatory Visit: Admitting: Internal Medicine

## 2024-11-03 DIAGNOSIS — E7800 Pure hypercholesterolemia, unspecified: Secondary | ICD-10-CM

## 2024-11-03 DIAGNOSIS — Z1231 Encounter for screening mammogram for malignant neoplasm of breast: Secondary | ICD-10-CM

## 2024-11-03 DIAGNOSIS — E039 Hypothyroidism, unspecified: Secondary | ICD-10-CM

## 2024-11-03 DIAGNOSIS — Z0000 Encounter for general adult medical examination without abnormal findings: Secondary | ICD-10-CM

## 2024-11-03 DIAGNOSIS — R739 Hyperglycemia, unspecified: Secondary | ICD-10-CM

## 2024-12-14 ENCOUNTER — Other Ambulatory Visit: Payer: Self-pay | Admitting: Internal Medicine

## 2024-12-29 ENCOUNTER — Ambulatory Visit: Payer: Medicare Other
